# Patient Record
Sex: Male | Born: 1935 | Smoking: Former smoker
Health system: Southern US, Community
[De-identification: ages and names within clinical notes are randomized; demographics above are authoritative.]

## PROBLEM LIST (undated history)

## (undated) DIAGNOSIS — K929 Disease of digestive system, unspecified: Secondary | ICD-10-CM

## (undated) DIAGNOSIS — I499 Cardiac arrhythmia, unspecified: Secondary | ICD-10-CM

## (undated) DIAGNOSIS — E079 Disorder of thyroid, unspecified: Secondary | ICD-10-CM

## (undated) DIAGNOSIS — D649 Anemia, unspecified: Secondary | ICD-10-CM

## (undated) DIAGNOSIS — I10 Essential (primary) hypertension: Secondary | ICD-10-CM

## (undated) HISTORY — DX: Essential (primary) hypertension: I10

## (undated) HISTORY — DX: Disease of digestive system, unspecified: K92.9

## (undated) HISTORY — DX: Cardiac arrhythmia, unspecified: I49.9

## (undated) HISTORY — PX: FOOT SURGERY: SHX648

## (undated) HISTORY — PX: COLONOSCOPY: SHX174

## (undated) HISTORY — DX: Disorder of thyroid, unspecified: E07.9

## (undated) HISTORY — DX: Anemia, unspecified: D64.9

## (undated) HISTORY — PX: CARPAL TUNNEL RELEASE: SHX101

---

## 2004-12-03 ENCOUNTER — Ambulatory Visit: Payer: Self-pay | Admitting: Internal Medicine

## 2005-05-15 ENCOUNTER — Ambulatory Visit: Payer: Self-pay | Admitting: Unknown Physician Specialty

## 2005-05-27 ENCOUNTER — Ambulatory Visit: Payer: Self-pay | Admitting: Unknown Physician Specialty

## 2012-05-07 DIAGNOSIS — I4891 Unspecified atrial fibrillation: Secondary | ICD-10-CM

## 2012-05-08 DIAGNOSIS — I4891 Unspecified atrial fibrillation: Secondary | ICD-10-CM

## 2012-05-20 ENCOUNTER — Emergency Department: Payer: Self-pay | Admitting: Emergency Medicine

## 2012-05-26 ENCOUNTER — Ambulatory Visit: Payer: Self-pay | Admitting: Internal Medicine

## 2012-05-27 ENCOUNTER — Encounter: Payer: Self-pay | Admitting: Internal Medicine

## 2012-06-06 ENCOUNTER — Other Ambulatory Visit: Payer: Self-pay | Admitting: Anesthesiology

## 2012-06-06 ENCOUNTER — Inpatient Hospital Stay: Payer: Self-pay | Admitting: Internal Medicine

## 2012-06-06 DIAGNOSIS — R079 Chest pain, unspecified: Secondary | ICD-10-CM

## 2012-06-06 LAB — TROPONIN I: Troponin-I: 0.1 ng/mL — ABNORMAL HIGH

## 2012-06-06 LAB — BASIC METABOLIC PANEL
Anion Gap: 9 (ref 7–16)
BUN: 29 mg/dL — ABNORMAL HIGH (ref 7–18)
EGFR (Non-African Amer.): 43 — ABNORMAL LOW
Glucose: 137 mg/dL — ABNORMAL HIGH (ref 65–99)
Potassium: 4.3 mmol/L (ref 3.5–5.1)
Sodium: 134 mmol/L — ABNORMAL LOW (ref 136–145)

## 2012-06-06 LAB — TSH: Thyroid Stimulating Horm: 4.11 u[IU]/mL

## 2012-06-06 LAB — CK TOTAL AND CKMB (NOT AT ARMC)
CK, Total: 17 U/L — ABNORMAL LOW (ref 35–232)
CK, Total: 18 U/L — ABNORMAL LOW (ref 35–232)
CK-MB: <0.5 ng/mL — ABNORMAL LOW (ref 0.5–3.6)
CK-MB: 1.3 ng/mL (ref 0.5–3.6)

## 2012-06-06 LAB — CBC
HCT: 30.9 % — ABNORMAL LOW (ref 40.0–52.0)
MCH: 29.4 pg (ref 26.0–34.0)
MCV: 89 fL (ref 80–100)
Platelet: 299 10*3/uL (ref 150–440)
RBC: 3.47 10*6/uL — ABNORMAL LOW (ref 4.40–5.90)
WBC: 7 10*3/uL (ref 3.8–10.6)

## 2012-06-07 DIAGNOSIS — R079 Chest pain, unspecified: Secondary | ICD-10-CM

## 2012-06-07 DIAGNOSIS — R002 Palpitations: Secondary | ICD-10-CM

## 2012-06-07 LAB — BASIC METABOLIC PANEL
Anion Gap: 8 (ref 7–16)
BUN: 23 mg/dL — ABNORMAL HIGH (ref 7–18)
Calcium, Total: 8.5 mg/dL (ref 8.5–10.1)
Chloride: 104 mmol/L (ref 98–107)
EGFR (African American): >60
EGFR (Non-African Amer.): 55 — ABNORMAL LOW
Glucose: 96 mg/dL (ref 65–99)
Osmolality: 277 (ref 275–301)
Sodium: 137 mmol/L (ref 136–145)

## 2012-06-07 LAB — CBC WITH DIFFERENTIAL/PLATELET
Basophil %: 0.5 %
HCT: 27.8 % — ABNORMAL LOW (ref 40.0–52.0)
HGB: 9.1 g/dL — ABNORMAL LOW (ref 13.0–18.0)
Lymphocyte #: 1.3 10*3/uL (ref 1.0–3.6)
Lymphocyte %: 18.2 %
MCH: 29.1 pg (ref 26.0–34.0)
MCHC: 32.7 g/dL (ref 32.0–36.0)
Monocyte #: 1.3 x10 3/mm — ABNORMAL HIGH (ref 0.2–1.0)
Neutrophil #: 4.5 10*3/uL (ref 1.4–6.5)
Neutrophil %: 61.3 %
RDW: 15 % — ABNORMAL HIGH (ref 11.5–14.5)
WBC: 7.4 10*3/uL (ref 3.8–10.6)

## 2012-06-07 LAB — CK TOTAL AND CKMB (NOT AT ARMC)
CK, Total: 25 U/L — ABNORMAL LOW (ref 35–232)
CK-MB: 1.3 ng/mL (ref 0.5–3.6)

## 2012-06-07 LAB — TROPONIN I: Troponin-I: 0.1 ng/mL — ABNORMAL HIGH

## 2012-06-08 DIAGNOSIS — I369 Nonrheumatic tricuspid valve disorder, unspecified: Secondary | ICD-10-CM

## 2012-06-08 DIAGNOSIS — I4892 Unspecified atrial flutter: Secondary | ICD-10-CM

## 2012-06-08 LAB — CBC WITH DIFFERENTIAL/PLATELET
Basophil #: 0 10*3/uL (ref 0.0–0.1)
Basophil %: 0.5 %
Eosinophil #: 0.2 10*3/uL (ref 0.0–0.7)
Lymphocyte %: 19.1 %
MCH: 30.2 pg (ref 26.0–34.0)
Monocyte #: 1.3 x10 3/mm — ABNORMAL HIGH (ref 0.2–1.0)
Neutrophil #: 4.4 10*3/uL (ref 1.4–6.5)
Neutrophil %: 60.1 %
Platelet: 253 10*3/uL (ref 150–440)
RBC: 3.23 10*6/uL — ABNORMAL LOW (ref 4.40–5.90)
WBC: 7.3 10*3/uL (ref 3.8–10.6)

## 2012-06-08 LAB — BASIC METABOLIC PANEL
Anion Gap: 7 (ref 7–16)
Calcium, Total: 8.3 mg/dL — ABNORMAL LOW (ref 8.5–10.1)
Chloride: 102 mmol/L (ref 98–107)
Co2: 25 mmol/L (ref 21–32)
Osmolality: 271 (ref 275–301)
Potassium: 4.4 mmol/L (ref 3.5–5.1)

## 2012-06-08 LAB — DIGOXIN LEVEL: Digoxin: 1.83 ng/mL

## 2012-06-09 LAB — HEMATOCRIT: HCT: 26 % — ABNORMAL LOW (ref 40.0–52.0)

## 2012-06-10 ENCOUNTER — Encounter: Payer: Self-pay | Admitting: Cardiovascular Disease

## 2012-06-10 ENCOUNTER — Encounter: Payer: Self-pay | Admitting: *Deleted

## 2012-06-16 ENCOUNTER — Encounter: Payer: Self-pay | Admitting: Internal Medicine

## 2012-06-20 ENCOUNTER — Ambulatory Visit (INDEPENDENT_AMBULATORY_CARE_PROVIDER_SITE_OTHER): Payer: Medicare Other | Admitting: Cardiovascular Disease

## 2012-06-20 ENCOUNTER — Encounter: Payer: Self-pay | Admitting: Cardiovascular Disease

## 2012-06-20 VITALS — BP 110/50 | HR 47 | Ht 71.0 in | Wt 163.0 lb

## 2012-06-20 DIAGNOSIS — S82209A Unspecified fracture of shaft of unspecified tibia, initial encounter for closed fracture: Secondary | ICD-10-CM | POA: Insufficient documentation

## 2012-06-20 DIAGNOSIS — I498 Other specified cardiac arrhythmias: Secondary | ICD-10-CM

## 2012-06-20 DIAGNOSIS — I499 Cardiac arrhythmia, unspecified: Secondary | ICD-10-CM

## 2012-06-20 DIAGNOSIS — R001 Bradycardia, unspecified: Secondary | ICD-10-CM | POA: Insufficient documentation

## 2012-06-20 DIAGNOSIS — I4892 Unspecified atrial flutter: Secondary | ICD-10-CM

## 2012-06-20 NOTE — Patient Instructions (Addendum)
You are doing well. Please decrease the metoprolol to 12.5 mg twice a day Regular diet Watch for edema or worsening shortness of breath   Please call us if you have new issues that need to be addressed before your next appt.  Your physician wants you to follow-up in: 3 months.  You will receive a reminder letter in the mail two months in advance. If you don't receive a letter, please call our office to schedule the follow-up appointment.

## 2012-06-20 NOTE — Assessment & Plan Note (Signed)
Currently participating in rehabilitation at [facility], toe-touch with a walker.

## 2012-06-20 NOTE — Assessment & Plan Note (Signed)
Presenting with sinus bradycardia on metoprolol tartrate 25 mg in the morning. Digoxin recently held. We have suggested he decrease his metoprolol tartrate to 12.5 mg twice a day. If he continues to have bradycardia, we had suggested hold parameters for [facility] to hold the a.m. dose of metoprolol for heart rate less than 55.

## 2012-06-20 NOTE — Progress Notes (Signed)
Patient ID: Gerald Hines, male    DOB: [birth year], 78 y.o.   MRN: [id number]  HPI Comments: Gerald Hines is a very pleasant 78 year old gentleman with a history of left leg fracture, he is currently at [facility] rehabilitation, emphysema seen on CT scan, anemia with hemoglobin 9.9, presenting to [facility] for planned EGD found to be in atrial fibrillation with heart rate up to 118 beats per minute, shortness of breath, chest pain radiating to the left arm. Arrhythmia resolved without significant intervention and he was started on digoxin and metoprolol.  He has had followup with Dr. Worthy Keeler and digoxin was held 3 days ago, but metoprolol was decreased to 25 mg daily down from 25 mg twice a day. He denies any lightheadedness or dizziness. He has been walking with toe touch and a walker. He does have mild shortness of breath with exertion at baseline. He denies any tachycardia or palpitations.  CT scan in the hospital showed no PE, signs of emphysematous changes in both lungs. No evidence of CHF Echocardiogram showed normal LV systolic function greater than 55%, normal left atrial size, right ventricular systolic pressure 30-40 mm of mercury  Initial blood work on July 22 showed BUN 29, creatinine 1.38. He received significant IV fluids with improvement in his renal function  Initial EKG showed atrial flutter with ventricular rate 130 beats per minute EKG today shows sinus bradycardia with rate 47 beats per minute, no significant ST or T wave changes   Outpatient Encounter Prescriptions as of 06/20/2012  Medication Sig Dispense Refill  . Ferrous Sulfate (IRON) 28 MG TABS Take 28 mg by mouth daily.      Marland Kitchen levothyroxine (SYNTHROID, LEVOTHROID) 75 MCG tablet Take 75 mcg by mouth daily.      . metoprolol tartrate (LOPRESSOR) 25 MG tablet Take 25 mg by mouth daily.      Marland Kitchen omeprazole (PRILOSEC) 20 MG capsule Take 20 mg by mouth daily.      . simethicone (MYLICON) 125 MG chewable tablet Chew 125 mg  by mouth at bedtime.      . Tamsulosin HCl (FLOMAX) 0.4 MG CAPS Take 0.4 mg by mouth daily.      . vitamin B-12 (CYANOCOBALAMIN) 1000 MCG tablet Take 1,000 mcg by mouth daily.        Review of Systems  Constitutional: Negative.   HENT: Negative.   Eyes: Negative.   Respiratory: Negative.   Cardiovascular: Negative.   Gastrointestinal: Negative.   Musculoskeletal: Positive for joint swelling, arthralgias and gait problem.  Skin: Negative.   Neurological: Negative.   Hematological: Negative.   Psychiatric/Behavioral: Negative.   All other systems reviewed and are negative.    BP 110/50  Pulse 47  Ht 5\' 11"  (1.803 m)  Wt 163 lb (73.936 kg)  BMI 22.73 kg/m2  Physical Exam  Nursing note and vitals reviewed. Constitutional: He is oriented to person, place, and time. He appears well-developed and well-nourished.  HENT:  Head: Normocephalic.  Nose: Nose normal.  Mouth/Throat: Oropharynx is clear and moist.  Eyes: Conjunctivae are normal. Pupils are equal, round, and reactive to light.  Neck: Normal range of motion. Neck supple. No JVD present.  Cardiovascular: Normal rate, regular rhythm, S1 normal, S2 normal, normal heart sounds and intact distal pulses.  Exam reveals no gallop and no friction rub.   No murmur heard. Pulmonary/Chest: Effort normal and breath sounds normal. No respiratory distress. He has no wheezes. He has no rales. He exhibits no tenderness.  Abdominal:  Soft. Bowel sounds are normal. He exhibits no distension. There is no tenderness.  Musculoskeletal: Normal range of motion. He exhibits no edema and no tenderness.  Lymphadenopathy:    He has no cervical adenopathy.  Neurological: He is alert and oriented to person, place, and time. Coordination normal.  Skin: Skin is warm and dry. No rash noted. No erythema.  Psychiatric: He has a normal mood and affect. His behavior is normal. Judgment and thought content normal.           Assessment and Plan

## 2012-06-20 NOTE — Assessment & Plan Note (Signed)
His rhythm appeared to be atrial flutter. If he has this rhythm in the future, options include trying an antiarrhythmic such as low-dose amiodarone. For persistent symptoms, possibly even ablation.

## 2012-06-21 LAB — DIGOXIN LEVEL: Digoxin: 0.28 ng/mL

## 2012-06-30 LAB — CBC WITH DIFFERENTIAL/PLATELET
Basophil #: 0 10*3/uL (ref 0.0–0.1)
Eosinophil: 2 %
HCT: 25.9 % — ABNORMAL LOW (ref 40.0–52.0)
HGB: 8.6 g/dL — ABNORMAL LOW (ref 13.0–18.0)
Lymphocytes: 18 %
MCHC: 33.1 g/dL (ref 32.0–36.0)
MCV: 86 fL (ref 80–100)
Platelet: 263 10*3/uL (ref 150–440)
RBC: 3.01 10*6/uL — ABNORMAL LOW (ref 4.40–5.90)
RDW: 16 % — ABNORMAL HIGH (ref 11.5–14.5)
Segmented Neutrophils: 61 %
WBC: 7 10*3/uL (ref 3.8–10.6)

## 2012-06-30 LAB — URINALYSIS, COMPLETE
Bacteria: NONE SEEN
Glucose,UR: NEGATIVE mg/dL (ref 0–75)
Leukocyte Esterase: NEGATIVE
Nitrite: NEGATIVE
RBC,UR: 1 /HPF (ref 0–5)
Squamous Epithelial: NONE SEEN
Transitional Epi: <1
WBC UR: <1 /HPF (ref 0–5)

## 2012-06-30 LAB — FERRITIN: Ferritin (ARMC): 589 ng/mL — ABNORMAL HIGH (ref 8–388)

## 2012-06-30 LAB — RETICULOCYTES: Absolute Retic Count: 0.031 10*6/uL (ref 0.031–0.129)

## 2012-06-30 LAB — IRON AND TIBC
Iron Bind.Cap.(Total): 194 ug/dL — ABNORMAL LOW (ref 250–450)
Iron: 38 ug/dL — ABNORMAL LOW (ref 65–175)
Unbound Iron-Bind.Cap.: 156 ug/dL

## 2012-06-30 LAB — FOLATE: Folic Acid: 19.7 ng/mL (ref 3.1–100.0)

## 2012-06-30 LAB — SEDIMENTATION RATE: Erythrocyte Sed Rate: >140 mm/hr — ABNORMAL HIGH (ref 0–20)

## 2012-07-01 ENCOUNTER — Telehealth: Payer: Self-pay

## 2012-07-01 LAB — STOOL CULTURE

## 2012-07-01 NOTE — Telephone Encounter (Signed)
Patient's report of vital signs since changed to Metoprolol to 12.5 mg one tablet twice a day. Heart rate is usually under 55 in the am.  A list of vital signs given to Dr. Mariah Milling for review. Blood pressure & HR range since 06/10/2012- 126/67 HR 67,  06/14/2012- 104/49 HR 48, 06/24/2012- 134/61 HR 48, 06/29/2012- 123/62 HR 57. Please advise.

## 2012-07-04 NOTE — Telephone Encounter (Signed)
I called and spoke with [facility] caregiver for pt. I advsied, per Dr. Windell Hummingbird last note, ok to hold am dose of metoprolol if HR<55 BPM.  Caregiver verb. Understanding.

## 2012-07-05 NOTE — Telephone Encounter (Signed)
Let's hold p.m. dose of metoprolol, continue a.m. Dose Low heart rate in the morning is likely from the evening dose thx

## 2012-07-06 NOTE — Telephone Encounter (Signed)
Caregiver informed at [facility] [place]. Understanding verb.

## 2012-07-17 ENCOUNTER — Encounter: Payer: Self-pay | Admitting: Internal Medicine

## 2012-07-29 ENCOUNTER — Telehealth: Payer: Self-pay | Admitting: Cardiovascular Disease

## 2012-07-29 NOTE — Telephone Encounter (Signed)
Caregiver made aware. She says HR only increases with exertion. Since episode the am, HR has been 60-70 BPM.   I had her hold while I addressed with Dr. Mariah Milling. He says we will not start amiodarone but instead order e cardio 30 day event monitor and bring pt back in 2 weeks. Pt's caregiver and son made aware. Understanding verb. appt made with dr. Mariah Milling 9/24 at 1:45 pm

## 2012-07-29 NOTE — Telephone Encounter (Signed)
I spoke with pt's care giver at [facility]. He says pt has been having these spells where pt "passes out with eyes open, unresponsive, lasting about 10 seconds each time".  Gerald Hines says he has had about 3 spells like this within the last 3 weeks.  The patient then ahd same spell today while pt's son was present. Son says these were happening at home as well, prior to coming to [facility]. Gerald Hines says BP stays around 89/64 and HR=160 BPM. Pt no longer takes the metoprolol. Pt is now alert and oriented. I told Gerald Hines I would discuss with Dr. Mariah Milling and call [facility] back. Understanding verb.

## 2012-07-29 NOTE — Telephone Encounter (Signed)
Pt is a [facility]. Nurse Jenita Seashore called stating pt has passed out 3 times within 3 weeks. His BP 89/64 and slowly goes back to normal. Pt says he feels dizzy when this happens not on any meds for bp No other symptoms.

## 2012-07-29 NOTE — Telephone Encounter (Signed)
"  Start amiodarone 200 mg BID x 4 days then decrease to 200 mg daily.  F/u with me in 2 weeks". V.O Dr. Alvis Lemmings, RN

## 2012-08-03 DIAGNOSIS — I4891 Unspecified atrial fibrillation: Secondary | ICD-10-CM

## 2012-08-09 ENCOUNTER — Ambulatory Visit (INDEPENDENT_AMBULATORY_CARE_PROVIDER_SITE_OTHER): Payer: Medicare Other | Admitting: Cardiovascular Disease

## 2012-08-09 ENCOUNTER — Encounter: Payer: Self-pay | Admitting: Cardiovascular Disease

## 2012-08-09 VITALS — BP 100/42 | HR 77 | Ht 71.0 in | Wt 152.0 lb

## 2012-08-09 DIAGNOSIS — R609 Edema, unspecified: Secondary | ICD-10-CM

## 2012-08-09 DIAGNOSIS — S82209A Unspecified fracture of shaft of unspecified tibia, initial encounter for closed fracture: Secondary | ICD-10-CM

## 2012-08-09 DIAGNOSIS — I4892 Unspecified atrial flutter: Secondary | ICD-10-CM

## 2012-08-09 DIAGNOSIS — I498 Other specified cardiac arrhythmias: Secondary | ICD-10-CM

## 2012-08-09 DIAGNOSIS — R001 Bradycardia, unspecified: Secondary | ICD-10-CM

## 2012-08-09 NOTE — Assessment & Plan Note (Signed)
Maintain normal sinus rhythm. The Toprol and digoxin held secondary to bradycardia

## 2012-08-09 NOTE — Progress Notes (Signed)
Patient ID: Gerald Hines, male    DOB: 1914/04/16, 78 y.o.   MRN: [id number]  HPI Comments: Gerald Hines is a very pleasant 78 year old gentleman with a history of left leg fracture, [facility], emphysema seen on CT scan, anemia with hemoglobin 9.9, presenting to [facility] for planned EGD found to be in atrial fibrillation with heart rate up to 118 beats per minute, shortness of breath, chest pain radiating to the left arm. Arrhythmia resolved without significant intervention and he was started on digoxin and metoprolol. Medications have since been held secondary to bradycardia and low blood pressure. He did have symptoms of dizziness which have since resolved.  He presents today and reports that overall he is doing better. He does have trace edema around his ankles which he reports is from drinking too much. Hemoglobin is low in the low 8 range and has been slowly declining. He reports blood pressure at home is in the 120 range over 60, heart rate in the 60s. He is currently wearing a 30 day monitor, day 5. No arrhythmia seen. Heart rate typically in the 60s to 70s range.  CT scan in the hospital showed no PE, signs of emphysematous changes in both lungs. No evidence of CHF Echocardiogram showed normal LV systolic function greater than 55%, normal left atrial size, right ventricular systolic pressure 30-40 mm of mercury  Initial blood work on July 22 showed BUN 29, creatinine 1.38. He received significant IV fluids with improvement in his renal function  Initial EKG showed atrial flutter with ventricular rate 130 beats per minute Followup EKG  showed sinus bradycardia with rate 47 beats per minute, no significant ST or T wave changes EKG today shows normal sinus rhythm, rate 77 beats per minute, no significant ST or T wave changes   Outpatient Encounter Prescriptions as of 08/09/2012  Medication Sig Dispense Refill  . Ferrous Sulfate (IRON) 28 MG TABS Take 28 mg by mouth daily.        Marland Kitchen levothyroxine (SYNTHROID, LEVOTHROID) 75 MCG tablet Take 75 mcg by mouth daily.      . simethicone (MYLICON) 125 MG chewable tablet Chew 125 mg by mouth at bedtime.      . vitamin B-12 (CYANOCOBALAMIN) 1000 MCG tablet Take 1,000 mcg by mouth daily.        Review of Systems  Constitutional: Negative.   HENT: Negative.   Eyes: Negative.   Respiratory: Negative.   Cardiovascular: Negative.   Gastrointestinal: Negative.   Musculoskeletal: Positive for joint swelling, arthralgias and gait problem.  Skin: Negative.   Neurological: Negative.   Hematological: Negative.   Psychiatric/Behavioral: Negative.   All other systems reviewed and are negative.    BP 100/42  Pulse 77  Ht 5\' 11"  (1.803 m)  Wt 152 lb (68.947 kg)  BMI 21.20 kg/m2  Physical Exam  Nursing note and vitals reviewed. Constitutional: He is oriented to person, place, and time. He appears well-developed and well-nourished.       Sitting in a wheelchair, thin  HENT:  Head: Normocephalic.  Nose: Nose normal.  Mouth/Throat: Oropharynx is clear and moist.  Eyes: Conjunctivae normal are normal. Pupils are equal, round, and reactive to light.  Neck: Normal range of motion. Neck supple. No JVD present.  Cardiovascular: Normal rate, regular rhythm, S1 normal, S2 normal, normal heart sounds and intact distal pulses.  Exam reveals no gallop and no friction rub.   No murmur heard.      Trace edema around ankles and feet  bilaterally  Pulmonary/Chest: Effort normal and breath sounds normal. No respiratory distress. He has no wheezes. He has no rales. He exhibits no tenderness.  Abdominal: Soft. Bowel sounds are normal. He exhibits no distension. There is no tenderness.  Musculoskeletal: Normal range of motion. He exhibits no edema and no tenderness.  Lymphadenopathy:    He has no cervical adenopathy.  Neurological: He is alert and oriented to person, place, and time. Coordination normal.  Skin: Skin is warm and dry. No rash  noted. No erythema.  Psychiatric: He has a normal mood and affect. His behavior is normal. Judgment and thought content normal.           Assessment and Plan

## 2012-08-09 NOTE — Patient Instructions (Addendum)
You are doing well. No medication changes were made.  Please call if leg edema gets worse. We would start a diuretic  Please call us if you have new issues that need to be addressed before your next appt.  Your physician wants you to follow-up in: 3 months.  You will receive a reminder letter in the mail two months in advance. If you don't receive a letter, please call our office to schedule the follow-up appointment.

## 2012-08-09 NOTE — Assessment & Plan Note (Signed)
He has finished his rehabilitation, and ambulating slowly. Continues to be very weak.

## 2012-08-09 NOTE — Assessment & Plan Note (Signed)
Mild edema of the feet and ankles. He reports he is drinking significant amount of fluids. We have suggested he decrease his by mouth fluid intake. He did report low sodium. Perhaps he could liberalize his salt intake. He could use Lasix as needed if the edema gets worse. He will call the office if he needs Lasix.

## 2012-08-09 NOTE — Assessment & Plan Note (Signed)
No significant bradycardia seen on monitor. He will continue to wear this for up to 30 days, as tolerated.

## 2012-08-31 ENCOUNTER — Emergency Department: Payer: Self-pay | Admitting: Emergency Medicine

## 2012-08-31 LAB — COMPREHENSIVE METABOLIC PANEL
Alkaline Phosphatase: 98 U/L (ref 50–136)
Bilirubin,Total: 0.6 mg/dL (ref 0.2–1.0)
Calcium, Total: 8.9 mg/dL (ref 8.5–10.1)
Co2: 24 mmol/L (ref 21–32)
Creatinine: 0.99 mg/dL (ref 0.60–1.30)
EGFR (African American): >60
Osmolality: 273 (ref 275–301)
Sodium: 136 mmol/L (ref 136–145)
Total Protein: 7 g/dL (ref 6.4–8.2)

## 2012-08-31 LAB — CK TOTAL AND CKMB (NOT AT ARMC)
CK, Total: 14 U/L — ABNORMAL LOW (ref 35–232)
CK-MB: <0.5 ng/mL — ABNORMAL LOW (ref 0.5–3.6)

## 2012-08-31 LAB — CBC
MCH: 29.1 pg (ref 26.0–34.0)
MCHC: 34.3 g/dL (ref 32.0–36.0)
MCV: 85 fL (ref 80–100)
Platelet: 309 10*3/uL (ref 150–440)
RBC: 3 10*6/uL — ABNORMAL LOW (ref 4.40–5.90)
RDW: 17.2 % — ABNORMAL HIGH (ref 11.5–14.5)
WBC: 9.1 10*3/uL (ref 3.8–10.6)

## 2012-08-31 LAB — TROPONIN I: Troponin-I: <0.02 ng/mL

## 2012-09-09 ENCOUNTER — Inpatient Hospital Stay: Payer: Self-pay | Admitting: Internal Medicine

## 2012-09-09 ENCOUNTER — Encounter: Payer: Self-pay | Admitting: Cardiovascular Disease

## 2012-09-09 LAB — CBC WITH DIFFERENTIAL/PLATELET
Basophil %: 0.4 %
Eosinophil #: 0 10*3/uL (ref 0.0–0.7)
Eosinophil %: 0.3 %
Lymphocyte #: 2.2 10*3/uL (ref 1.0–3.6)
MCH: 27 pg (ref 26.0–34.0)
MCHC: 32.1 g/dL (ref 32.0–36.0)
Monocyte #: 1.9 x10 3/mm — ABNORMAL HIGH (ref 0.2–1.0)
Platelet: 384 10*3/uL (ref 150–440)
RDW: 17.7 % — ABNORMAL HIGH (ref 11.5–14.5)
Segmented Neutrophils: 70 %
Variant Lymphocyte - H1-Rlymph: 5 %
WBC: 12.1 10*3/uL — ABNORMAL HIGH (ref 3.8–10.6)

## 2012-09-09 LAB — COMPREHENSIVE METABOLIC PANEL
Alkaline Phosphatase: 106 U/L (ref 50–136)
Anion Gap: 10 (ref 7–16)
BUN: 16 mg/dL (ref 7–18)
Calcium, Total: 9 mg/dL (ref 8.5–10.1)
Co2: 27 mmol/L (ref 21–32)
Creatinine: 0.91 mg/dL (ref 0.60–1.30)
EGFR (African American): >60
EGFR (Non-African Amer.): >60
Osmolality: 268 (ref 275–301)
Potassium: 3.7 mmol/L (ref 3.5–5.1)
SGPT (ALT): 47 U/L (ref 12–78)
Sodium: 133 mmol/L — ABNORMAL LOW (ref 136–145)
Total Protein: 7.2 g/dL (ref 6.4–8.2)

## 2012-09-09 LAB — PROTIME-INR
INR: 1.2
Prothrombin Time: 15.7 secs — ABNORMAL HIGH (ref 11.5–14.7)

## 2012-09-09 LAB — APTT: Activated PTT: 44.9 secs — ABNORMAL HIGH (ref 23.6–35.9)

## 2012-09-10 LAB — URINALYSIS, COMPLETE
Bacteria: NONE SEEN
Leukocyte Esterase: NEGATIVE
Nitrite: NEGATIVE
RBC,UR: 1 /HPF (ref 0–5)
Squamous Epithelial: NONE SEEN
WBC UR: 1 /HPF (ref 0–5)

## 2012-09-10 LAB — CBC WITH DIFFERENTIAL/PLATELET
Basophil #: 0 10*3/uL (ref 0.0–0.1)
Eosinophil #: 0 10*3/uL (ref 0.0–0.7)
Eosinophil %: 0.4 %
HGB: 8.3 g/dL — ABNORMAL LOW (ref 13.0–18.0)
Lymphocyte %: 17.7 %
MCH: 28.1 pg (ref 26.0–34.0)
MCHC: 33.4 g/dL (ref 32.0–36.0)
Monocyte #: 1.3 x10 3/mm — ABNORMAL HIGH (ref 0.2–1.0)
Neutrophil %: 65.7 %
Platelet: 282 10*3/uL (ref 150–440)
RBC: 2.96 10*6/uL — ABNORMAL LOW (ref 4.40–5.90)
WBC: 8.2 10*3/uL (ref 3.8–10.6)

## 2012-09-10 LAB — BASIC METABOLIC PANEL
Anion Gap: 9 (ref 7–16)
BUN: 14 mg/dL (ref 7–18)
Calcium, Total: 8.4 mg/dL — ABNORMAL LOW (ref 8.5–10.1)
Creatinine: 0.86 mg/dL (ref 0.60–1.30)
EGFR (African American): >60
EGFR (Non-African Amer.): >60
Glucose: 105 mg/dL — ABNORMAL HIGH (ref 65–99)
Osmolality: 273 (ref 275–301)

## 2012-09-10 LAB — TROPONIN I: Troponin-I: 0.11 ng/mL — ABNORMAL HIGH

## 2012-09-11 LAB — CBC WITH DIFFERENTIAL/PLATELET
Basophil #: 0.1 10*3/uL (ref 0.0–0.1)
Eosinophil #: 0 10*3/uL (ref 0.0–0.7)
HGB: 9.2 g/dL — ABNORMAL LOW (ref 13.0–18.0)
Lymphocyte %: 18.1 %
MCHC: 33 g/dL (ref 32.0–36.0)
Monocyte #: 1.4 x10 3/mm — ABNORMAL HIGH (ref 0.2–1.0)
Neutrophil %: 63.8 %
RDW: 17.1 % — ABNORMAL HIGH (ref 11.5–14.5)
WBC: 8.6 10*3/uL (ref 3.8–10.6)

## 2012-09-12 LAB — CBC WITH DIFFERENTIAL/PLATELET
Basophil #: 0.1 10*3/uL (ref 0.0–0.1)
Basophil %: 1.3 %
Eosinophil %: 0.5 %
HCT: 28 % — ABNORMAL LOW (ref 40.0–52.0)
HGB: 9.4 g/dL — ABNORMAL LOW (ref 13.0–18.0)
Lymphocyte #: 1.6 10*3/uL (ref 1.0–3.6)
Lymphocyte %: 17.5 %
MCV: 85 fL (ref 80–100)
Monocyte #: 1.3 x10 3/mm — ABNORMAL HIGH (ref 0.2–1.0)
Monocyte %: 14.3 %
Neutrophil #: 6 10*3/uL (ref 1.4–6.5)
RBC: 3.3 10*6/uL — ABNORMAL LOW (ref 4.40–5.90)
RDW: 17.4 % — ABNORMAL HIGH (ref 11.5–14.5)

## 2012-09-14 LAB — PATHOLOGY REPORT

## 2012-09-15 ENCOUNTER — Encounter: Payer: Self-pay | Admitting: Internal Medicine

## 2012-09-16 ENCOUNTER — Encounter: Payer: Self-pay | Admitting: Internal Medicine

## 2012-09-21 ENCOUNTER — Ambulatory Visit: Payer: Medicare Other | Admitting: Cardiovascular Disease

## 2012-11-16 DEATH — deceased

## 2013-06-29 IMAGING — CR DG TIBIA/FIBULA 2V*L*
1 series · 3 of 3 positions shown · non-contrast
Comparison: none

REASON FOR EXAM: pain 2nd to fall
COMMENTS:   May transport without cardiac monitor

[Series 5: x tib-fib lat left · 0.14mm/px · 3 of 3 slices shown]
[im 1/3]
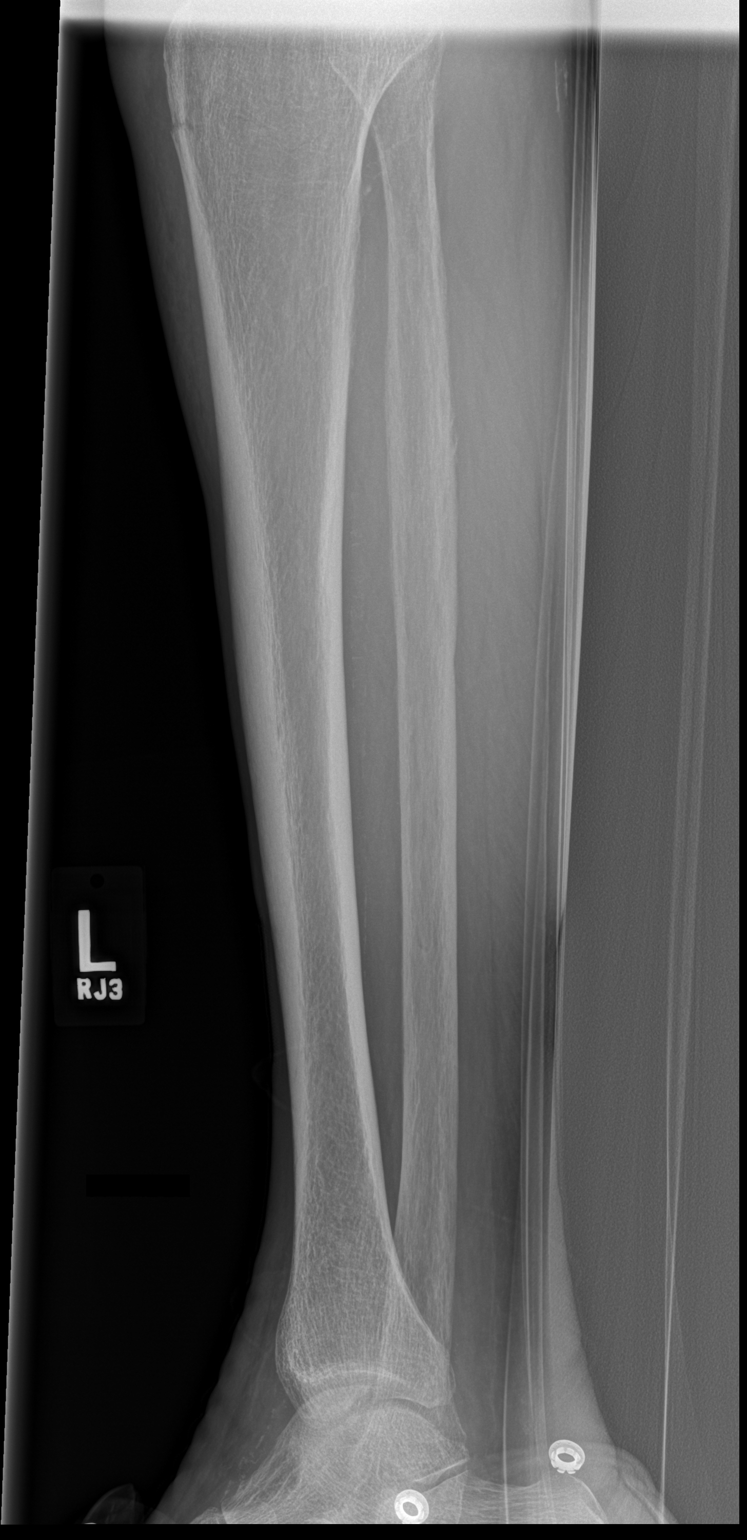
[im 2/3]
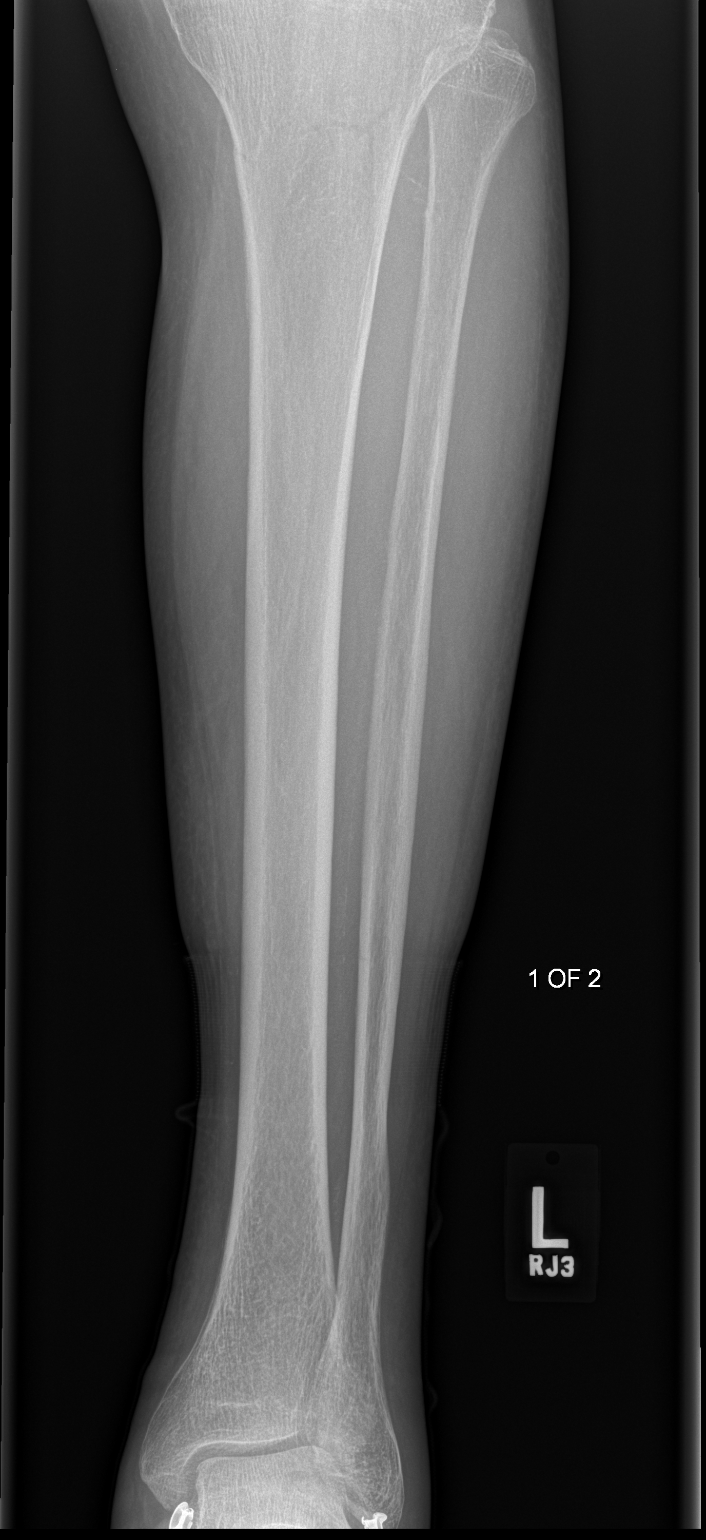
[im 3/3]
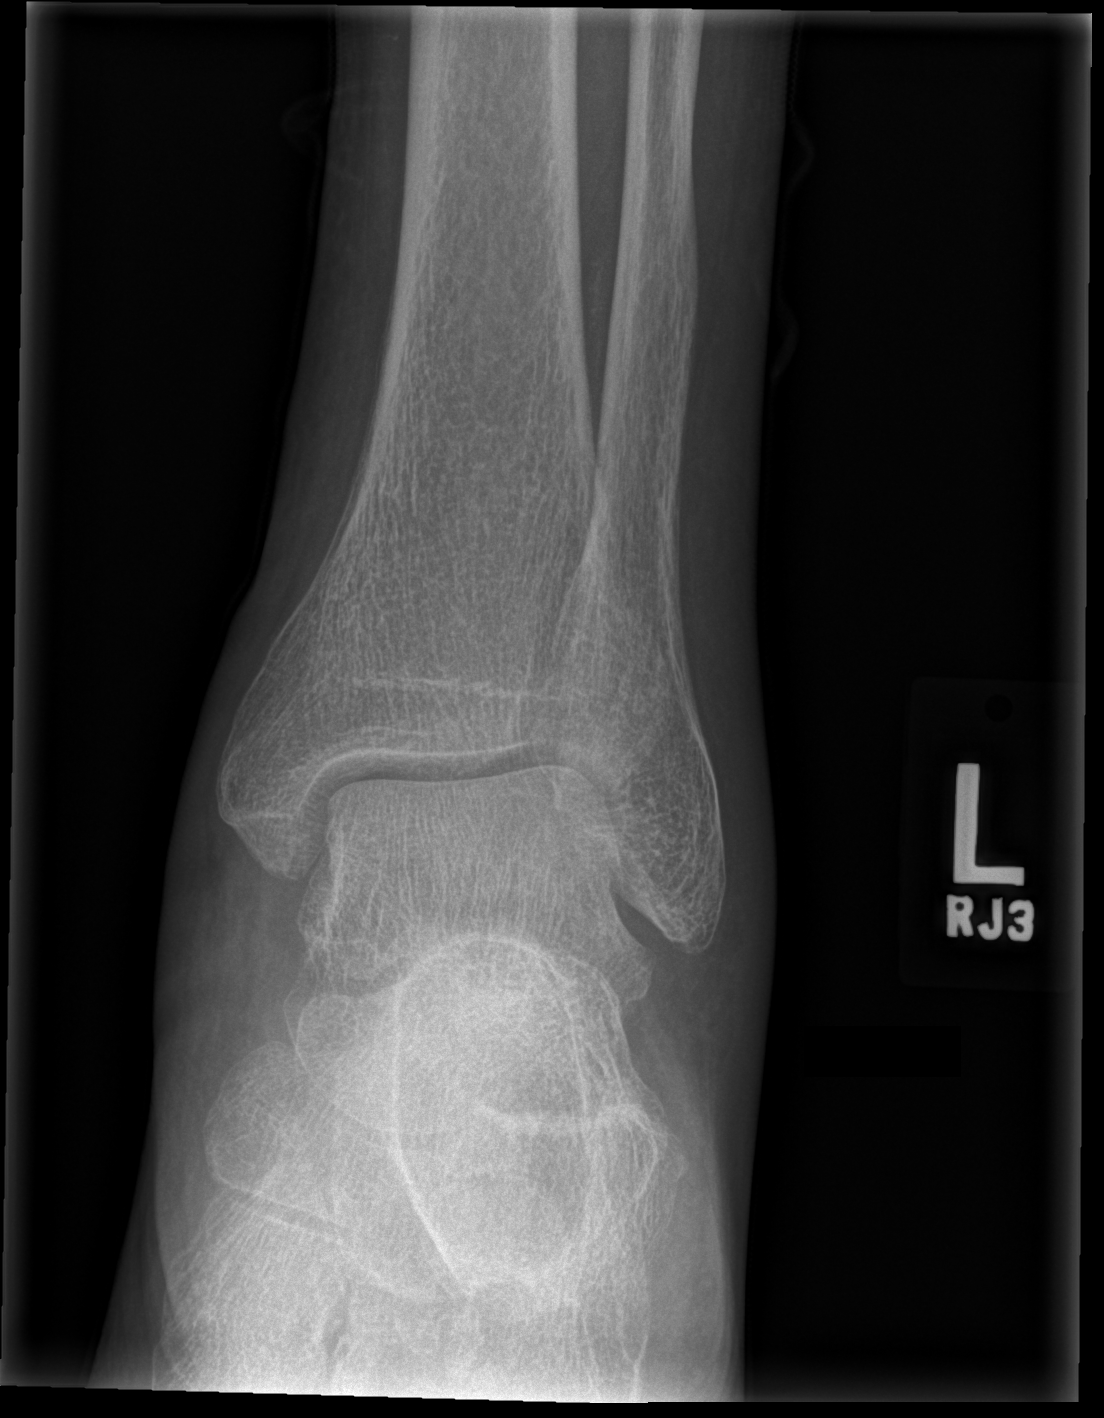

[3 of 3 positions shown; findings below may reference images not displayed]

PROCEDURE:     DXR - DXR TIBIA AND FIBULA LT (LOWER L  - May 20, 2012  [DATE]

RESULT:     Images of the left tibia and fibula show osteopenia. There is a
poorly demonstrated area of lucency in the proximal tibial shaft just
inferior to the region of the tibial tuberosity consistent with a
nondisplaced fracture. This may be incomplete. Fibula appears intact.
IMPRESSION: 1. Nondisplaced proximal left tibial fracture.

[REDACTED]

## 2013-06-29 IMAGING — CR DG CHEST 1V
1 series · 1 of 1 positions shown · non-contrast
Comparison: none

REASON FOR EXAM: chest pain
COMMENTS:   May transport without cardiac monitor

PROCEDURE:     DXR - DXR CHEST 1 VIEWAP OR PA  - May 20, 2012  [DATE]
RESULT:     Comparison: None

[t chest supine]
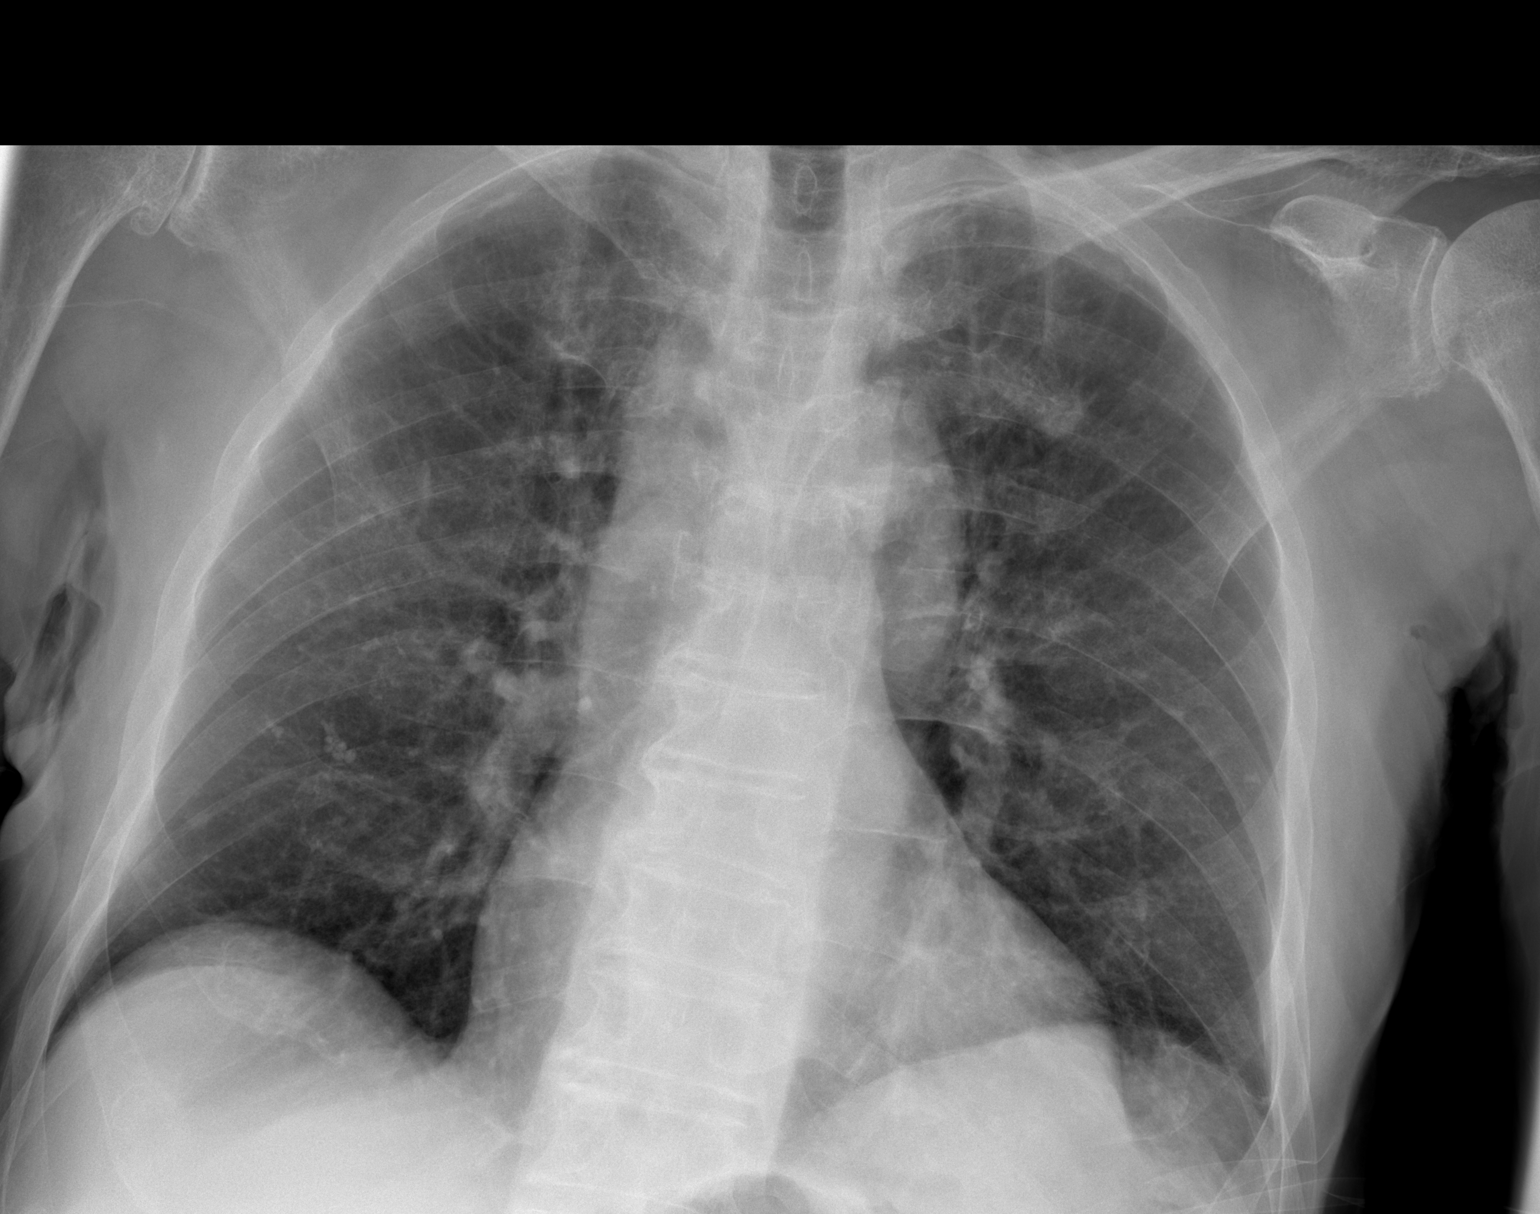

[1 of 1 positions shown; findings below may reference images not displayed]

FINDINGS: Single portable AP chest radiograph is provided.  There is no focal
parenchymal opacity, pleural effusion, or pneumothorax. Normal
cardiomediastinal silhouette. The osseous structures are unremarkable.
IMPRESSION: No acute disease of the che[REDACTED]

## 2013-07-05 IMAGING — US US EXTREM LOW VENOUS*L*
1 series · 14 of 24 positions shown · non-contrast
Comparison: none

REASON FOR EXAM: increased edema  recent lt leg fracture  CR  [id number]
COMMENTS:

[Series 1: us extrem low venous*left* · 0.11mm/px · 14 of 25 slices shown]
[im 1/25]
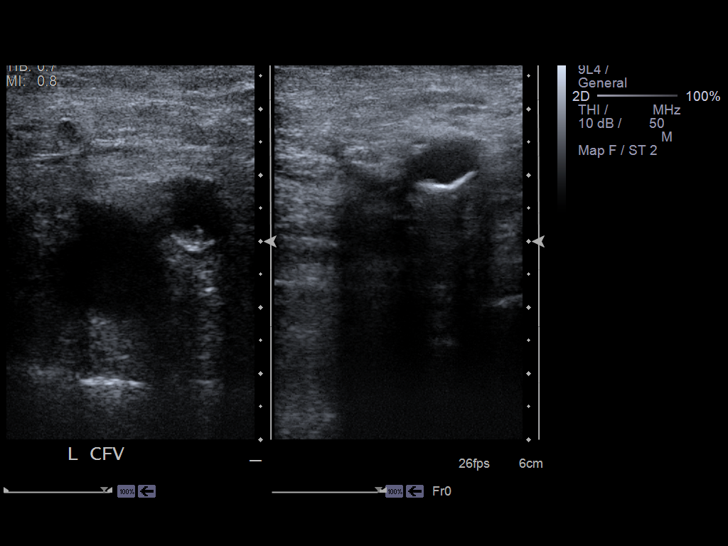
[im 3/25]
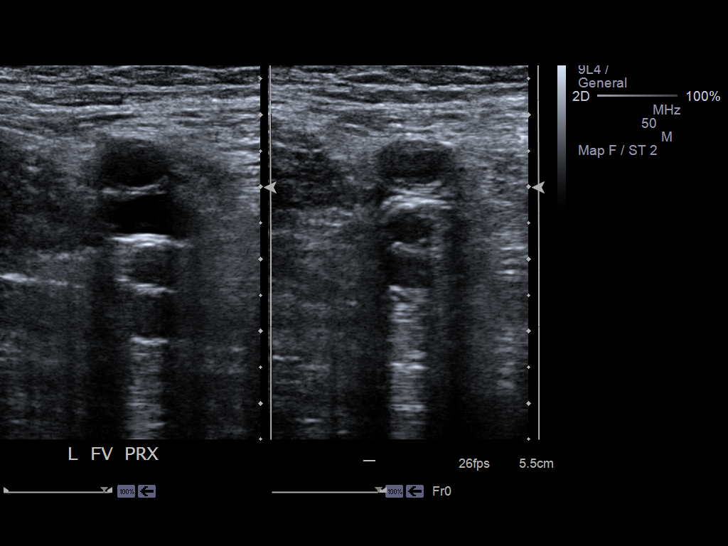
[im 5/25]
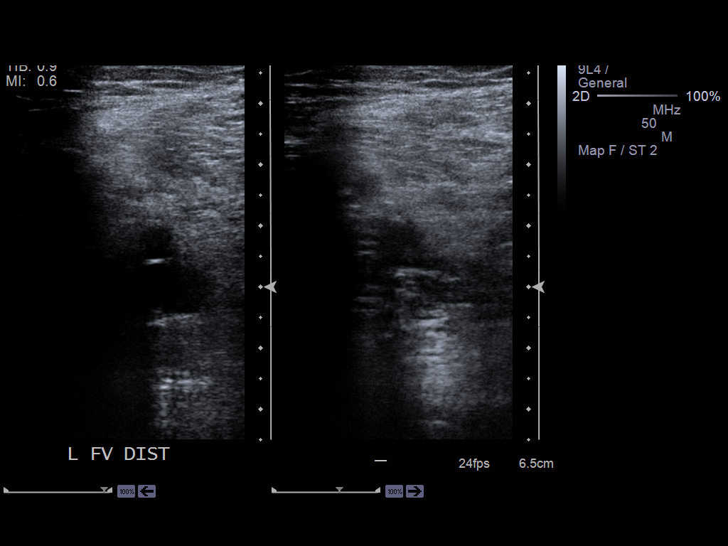
[im 7/25]
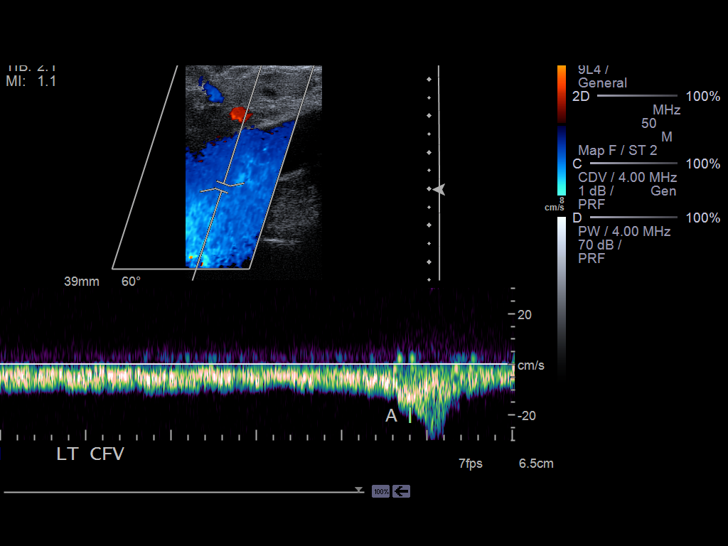
[im 8/25]
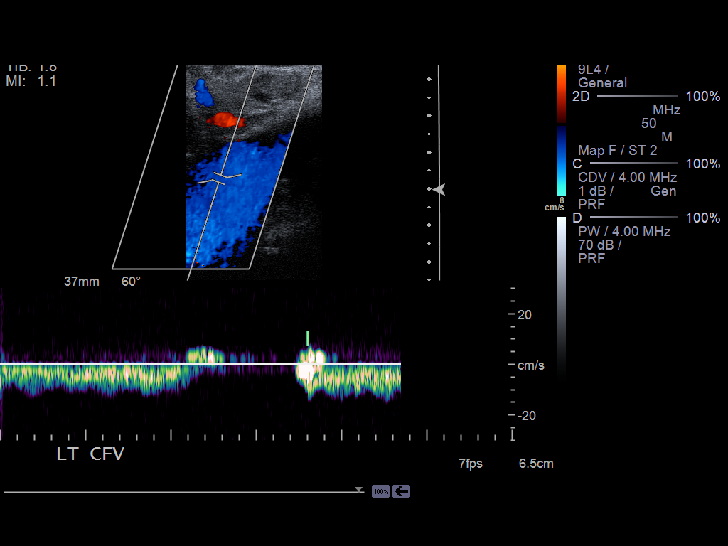
[im 10/25]
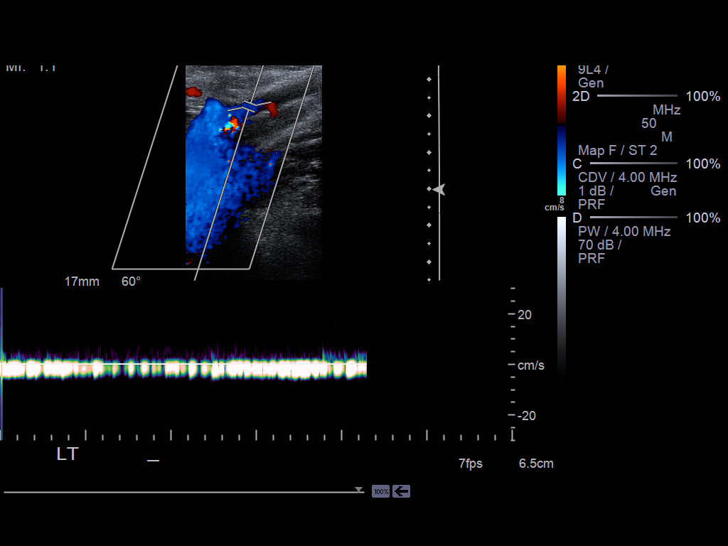
[im 12/25]
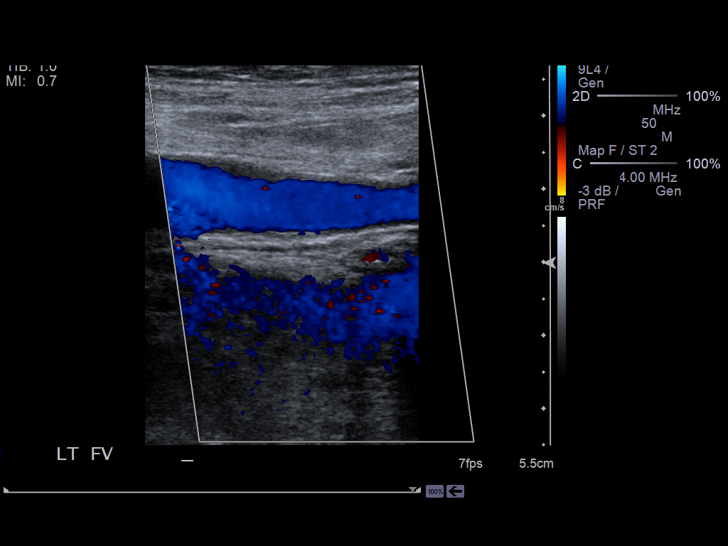
[im 13/25]
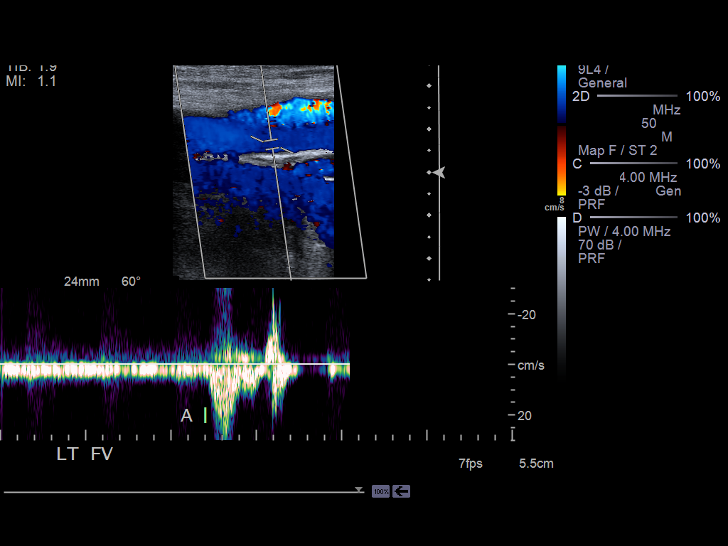
[im 15/25]
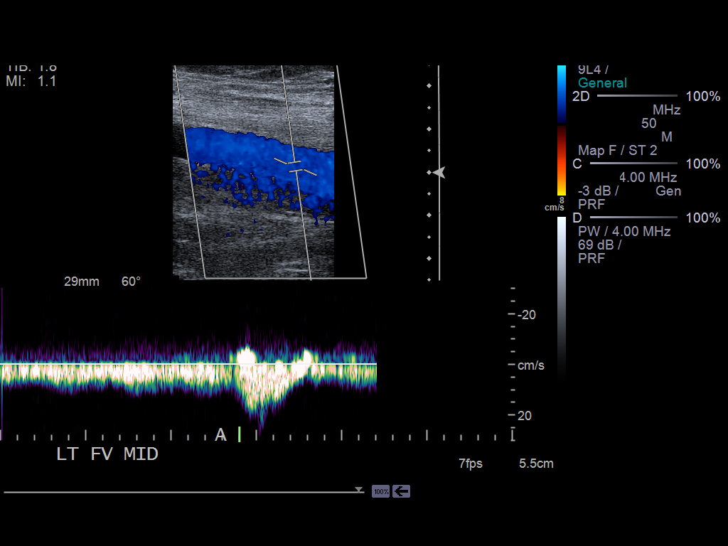
[im 17/25]
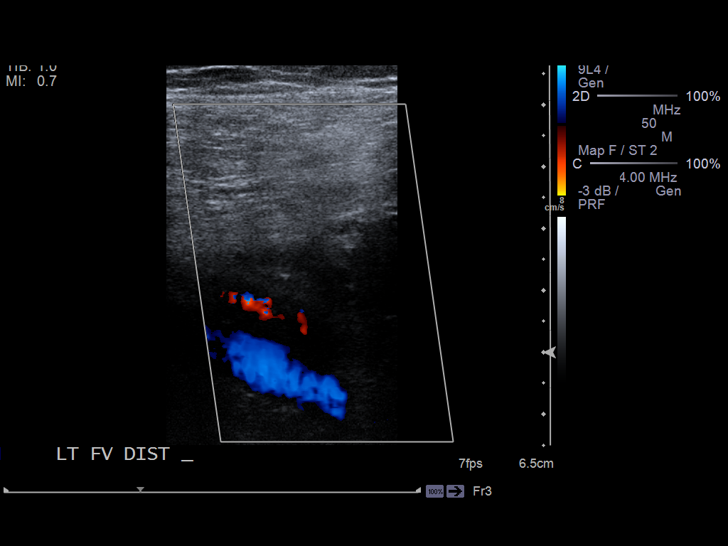
[im 19/25]
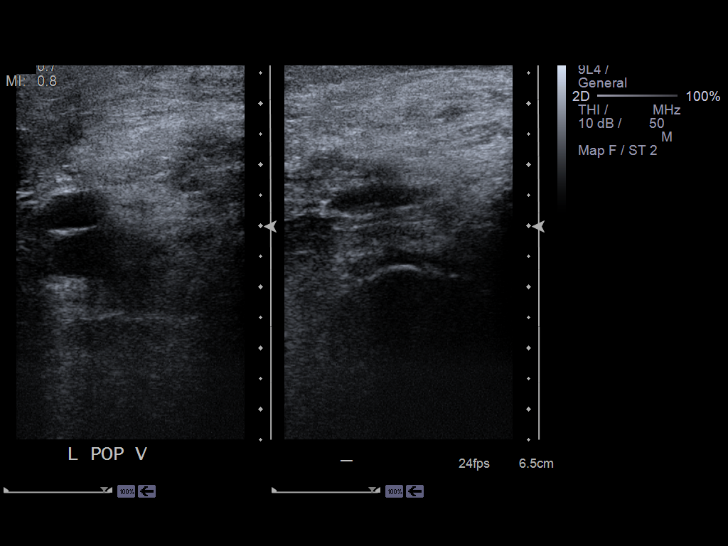
[im 20/25]
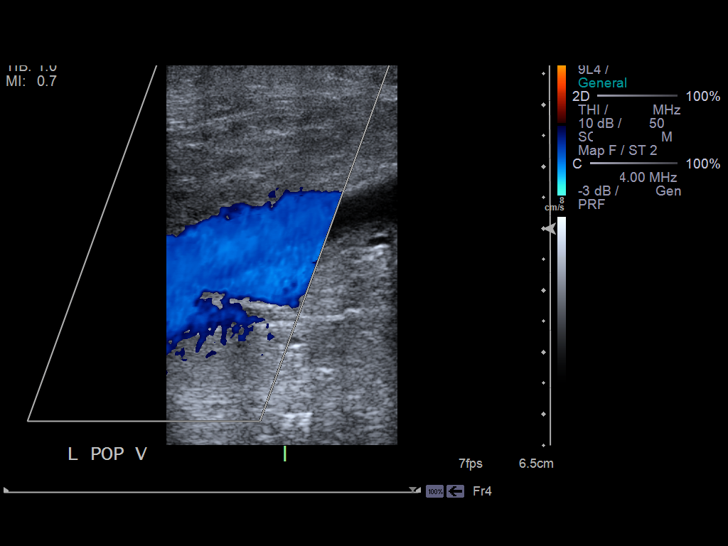
[im 22/25]
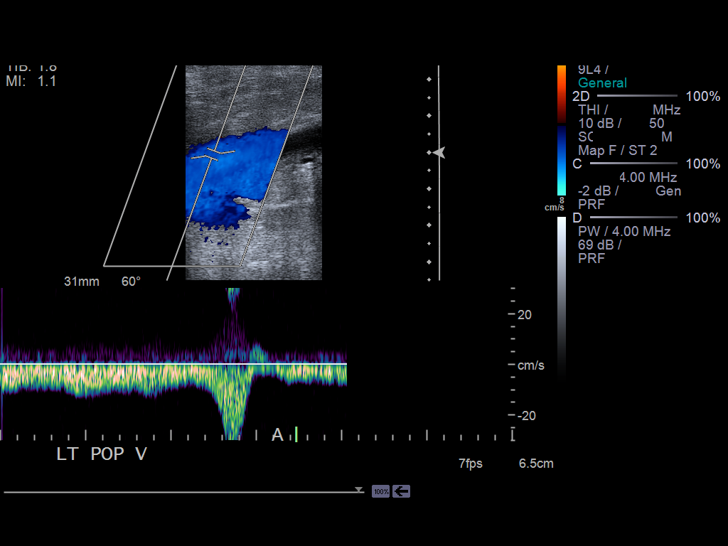
[im 25/25]
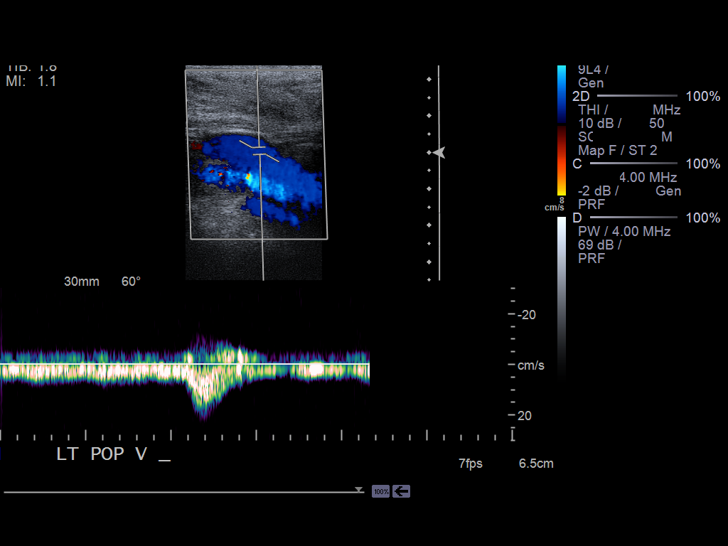

[14 of 24 positions shown; findings below may reference images not displayed]

PROCEDURE:     US  - US DOPPLER LOW EXTR LEFT  - May 26, 2012  [DATE]

RESULT:     Comparison: None

Technique and findings: Multiple longitudinal and transverse grayscale as
well as color and spectral Doppler images of the left lower extremity veins
were obtained from the common femoral veins through the popliteal veins.

The left common femoral, femoral, and popliteal veins are patent,
demonstrating normal color-flow and compressibility. No intraluminal
thrombus is identified.  There is normal respiratory variation and
augmentation demonstrated at all vein levels.
IMPRESSION: No evidence of DVT in the left lower extremity.

## 2013-07-16 IMAGING — CR DG CHEST 1V PORT
1 series · 1 of 1 positions shown · non-contrast
Comparison: none

REASON FOR EXAM: Chest Pain
COMMENTS:

PROCEDURE:     DXR - DXR PORTABLE CHEST SINGLE VIEW  - June 06, 2012 [DATE]
RESULT:     The lungs are clear. The cardiac silhouette and visualized bony
skeleton are unremarkable.

[portable]
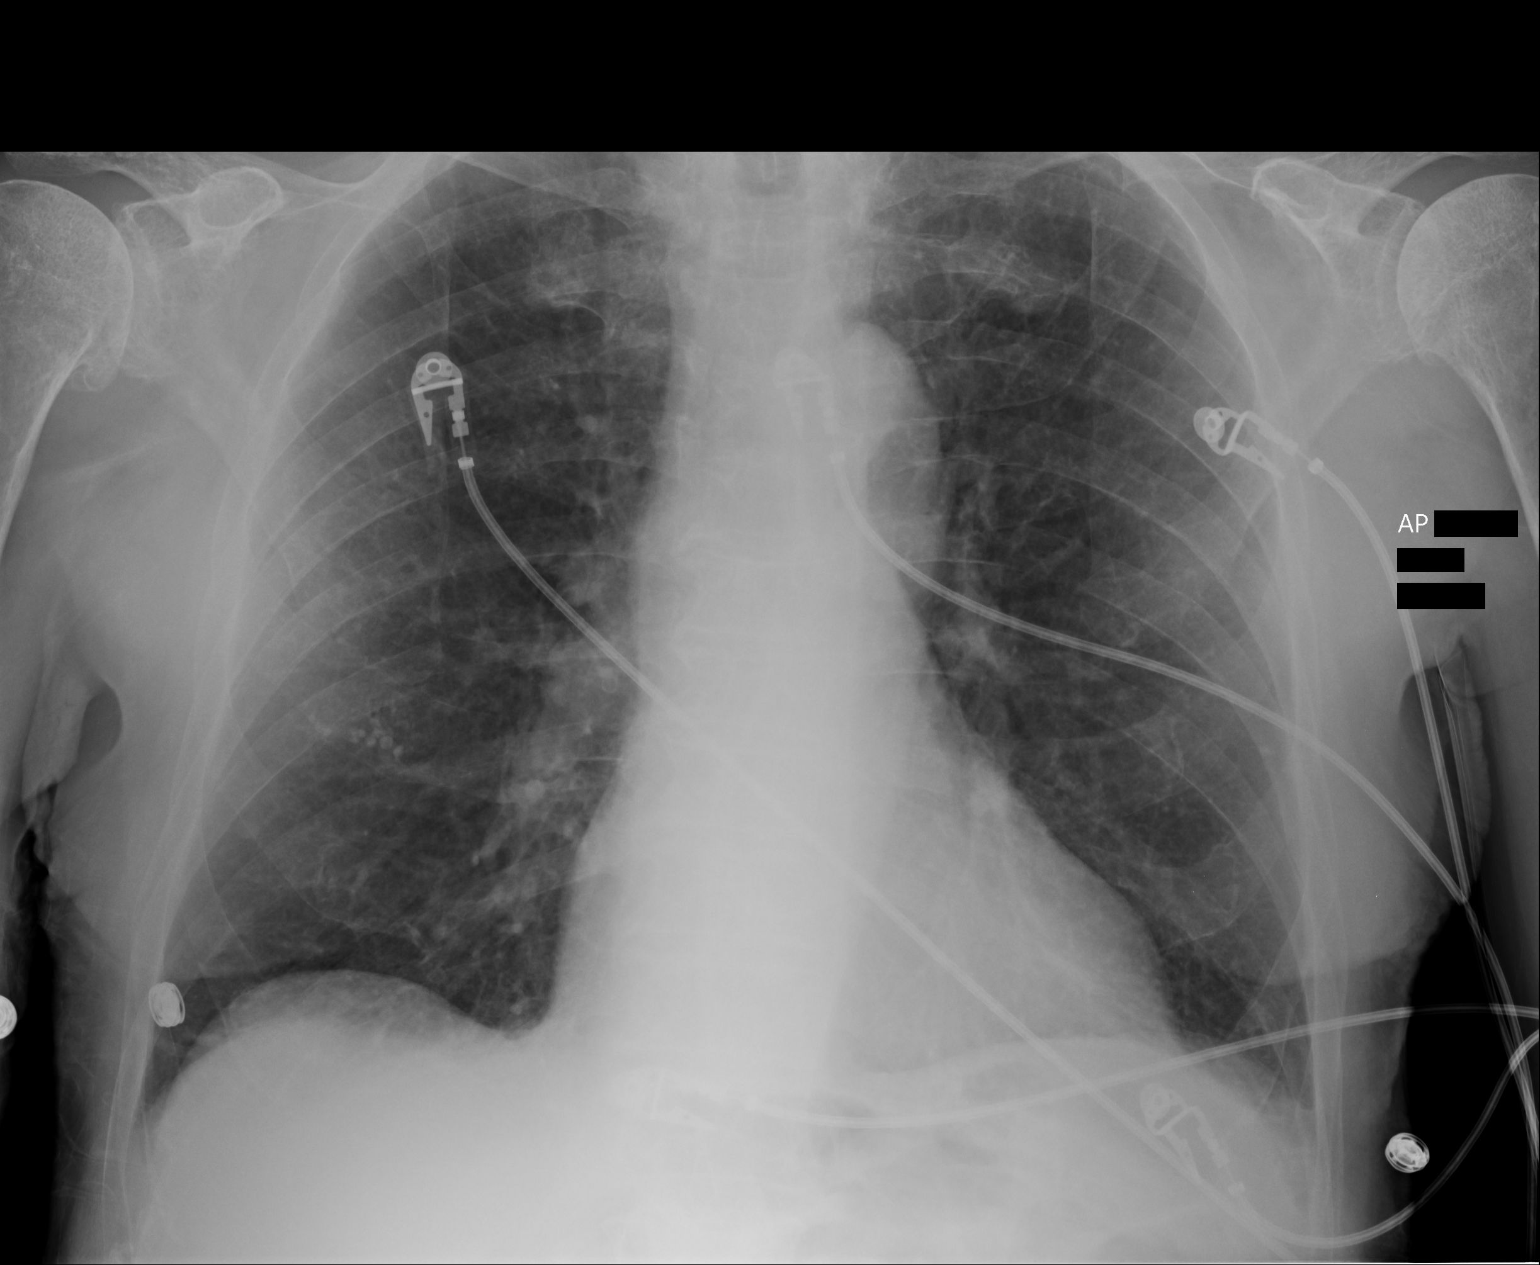

[1 of 1 positions shown; findings below may reference images not displayed]

IMPRESSION: 1. Chest radiograph without evidence of acute cardiopulmonary disease.
2. A comparison is made to prior study dated 05/20/2012.

## 2015-03-05 NOTE — Consult Note (Signed)
PATIENT NAME:  Gerald Hines, Gerald Hines MR#:  [id number] DATE OF BIRTH:  06-17-1914  DATE OF CONSULTATION:  09/09/2012  REFERRING PHYSICIAN:  Dr. Daniel Nones CONSULTING PHYSICIAN:  Rodman Key, NP / Dr. Lynnae Prude   PRIMARY CARE PHYSICIAN: Dr. Daniel Nones   REASON FOR CONSULTATION: GI bleed, anemia.   HISTORY OF PRESENT ILLNESS: Gerald Hines is a 81 year old Caucasian gentleman who has a significant past medical history of hypertension, atrial fibrillation, vitamin B12 deficiency and hypothyroidism. He had previously been admitted during this summer for worsening of anemia as well as the finding of heme-positive stool. He had an upper endoscopy done by Dr. Lutricia Feil during this hospitalization which revealed to be unremarkable. It was discussed with patient to proceed forward with a colonoscopy to allow direct luminal evaluation of lower GI tract and patient declined at that time. He has had two colonoscopies performed in his lifetime. Colonic polyps been retrieved both times and most recent colonoscopy was over 10 years ago. He was seen in our office by myself in follow up on 06/16/2012 after having been hospitalized and having same day surgery done. At that time again it was discussed to proceed with a colonoscopy and he declined. He had unfortunately fallen since his last office visit and broken his leg. Prior to the procedure he has been essentially on a gluten-free and lactose-free diet. After the procedure he started introducing glutenin and lactose products back into his diet and started to experience diarrhea again. Family members who are present state that it really has not made a difference on what type of a diet that he had been on. He continues to experience loose stools several times a day. Though when he was off of the gluten-free and the lactose diet his bowel movements were twelve times a day and when seen 08/01 it was down to three or four times a day. He does occasionally skip a day  without defecation as well. At this time he continues to decline nausea, vomiting, no abdominal pain. He has had no further falls for the past 3-1/2 months. He is complaining of shortness of breath. He has had no chest pain. Significant for edema bilaterally to lower extremities which has been present for numerous months, has also progressively became worse.   Patient was found to have a hemoglobin of 7.8 which was a noted drop from the range of 9 in August. From 8.5 about a week and a half ago. He has had significant amount of intestinal gas. During office visit with Dr. Graciela Husbands today color was noted to be pale and diagnosed with symptomatic anemia. Thus he was admitted to [facility]. He was initially admitted to room 217, upon hooking up on a heart monitor he was found to be in supraventricular tachycardia rhythm of 150 to 160. He has a known history of atrial fibrillation possibly underlying rhythm as well though rhythm appeared regular on monitor. Pending Cardizem drip being started.   PAST MEDICAL HISTORY:  1. Hypertension. 2. Chronic obstructive pulmonary disease. 3. Vitamin B12 deficiency. 4. Carpal tunnel with prior carpal total release. 5. History of colonic polyps.  6. History of atrial fibrillation. 7. Hypothyroidism. 8. Left spiral tibial fracture July 2013. 9. Status post appendectomy.  10. History of surgery on the right arm for skin cancer.   PAST SURGICAL HISTORY:  1. Appendectomy. 2. Right arm surgery.  3. Two colonoscopies in the past, most recent one was greater than 10 years ago.  4. EGD that was  done 2013.   ALLERGIES: ACE inhibitors cause hyperkalemia. Combivent causes headache. Ambien causes confusion.   HOME MEDICATIONS:  1. Levothyroxine 0.75 mg once a day. 2. Vitamin B12 500 mcg once a day. 3. Iron 145 mg once a day.  4. Gas relief extra strength 125 mg twice a day as needed. 5. Astelin 1 spray each nostril twice a day. 6. Tessalon Perles 100 mg 3 times a day as needed  for cough.   FAMILY HISTORY: Significant for hypertension. No family history of neoplasm.   SOCIAL HISTORY: No tobacco use for many years. Alcohol one glass of wine or beer a day, although he has recently abstained.   REVIEW OF SYSTEMS: Please see history of present illness. No fevers. No chills. Appetite is poor, four pound weight loss over the past month. No night sweats. No recent chest pain. Significant for shortness of breath. No GU complaints. No rashes. Remainder of complete review of systems is negative.   PHYSICAL EXAMINATION:  VITAL SIGNS: Temperature 98, pulse 158, respirations 21, blood pressure 99/56 with a pulse oximetry of 96% on room air.   GENERAL: Well-developed, slender 81 year old Caucasian gentleman, no initial distress noted but standing next to bed evidence of bed mildly shaking due to significance of heart rate. Slight dyspnea. No use of accessory muscles.   HEENT: Normocephalic, atraumatic. Pupils equal, reactive to light. Conjunctivae clear. Sclerae anicteric.   NECK: Supple. Trachea midline. Evidence of mild distention of neck veins with pulsation probably in relation to heart rate.   ABDOMEN: Soft, nondistended. Bowel sounds in four quadrants. No bruits. No masses, nontender.   RECTAL: Deferred.   MUSCULOSKELETAL: Gait not assessed. No contractures. No clubbing.   EXTREMITIES: +1 to +2 pitting edema bilaterally.   SKIN: No rashes. No lesion. Color pale, warm, dry.   NEUROLOGICAL: No gross neurological deficits noted.   PSYCH: Alert and oriented x4. Memory intact.   LABORATORY, DIAGNOSTIC AND RADIOLOGICAL DATA: Sodium 133, chloride 96, otherwise within normal limits. Hepatic panel: Albumin 2.1, AST is elevated at 65, otherwise within normal limits. Troponin 0.02. CBC: Hemoglobin 8.3 with hematocrit 25.9, RDW 17.7 with an RBC of 3.09. PT 15.7, INR 1.2 and PTT 44.9.   IMPRESSION: Chronic anemia with suspected chronic GI bleed. Hemoccult card August 2013 was  negative after having undergone GI evaluation inclusive of upper GI which was unremarkable. Patient declined proceeding with diagnostic colonoscopy during this timeframe. He wished to proceed with iron supplementation a d close monitoring. History of atrial fibrillation. Currently not on anticoagulant therapy for the past several weeks. Patient has been transferred to Critical Care Unit for heart rate of 150 to 160 symptomatic with shortness of breath.   PLAN: Patient's presentation was discussed with Dr. Lynnae Prudeobert Elliott. Recommendation initially is to proceed with colonic cleansing on Sunday with diagnostic colonoscopy on Monday of next week as patient is in agreement now to proceed forward in this manner. Due to current cardiac presentation and heart rate recommendation is to monitor over the weekend then date of colonoscopy to be decided when patient is clinically able. Cardiology consultation is requested. Order placed. Pending blood transfusion 1 unit has been ordered by Dr. Daniel NonesBert Klein and Cardizem CD drip is to be started. Will continue to follow, monitor hemodynamic status as well as laboratory studies.   These services were provided by myself under collaborative agreement with Dr. Lynnae Prudeobert Elliott.   ____________________________ Rodman Keyawn S. Harrison, NP dsh:cms D: 09/09/2012 16:56:43 ET T: 09/10/2012 11:41:16 ET JOB#: [id number]  cc:  Rodman Key, NP, <Dictator> DAWN Mort Sawyers MD ELECTRONICALLY SIGNED 09/19/2012 7:55

## 2015-03-05 NOTE — Consult Note (Signed)
Brief Consult Note: Diagnosis: Chronic anemia with suspected chronic GI bleed. Hemoccult card August 2013 was negative after being admitted for GI bleed.  Finding of anemia and heme positive stool.  EGD was unremarkable.  Patient declined proceeding with diagnostic colonoscopy at that time.  Wishes to proceed with iron supplementation and close monitoring.  History of atrial fibrillation.  Currently not on anticoagulation therapy. Transferred to CCU for heart rate of 150-160. Symptomatic with shortness of breath.   Consult note dictated.   Comments: Patient's presentation discussed with Dr. Lynnae Prudeobert Elliott.  Recommendation initially was to proceed with colonic cleansing on Sunday with diagnostic colonoscopy Monday of next week as patient is in agreement to proceed forward in this manner.  Due to current cardiac presentation and heart rate recommendation is to monitor over the weekend and then date of colonoscopy to be decided.  Cardiology consultation requested.  Pending blood transfusion and Cardizem gtt to be started. Will continue to follow, monitor hemodynamic status as well as laboratory studies.  Electronic Signatures: Rodman KeyHarrison, Dawn S (NP)  (Signed 25-Oct-13 16:46)  Authored: Brief Consult Note   Last Updated: 25-Oct-13 16:46 by Rodman KeyHarrison, Dawn S (NP)

## 2015-03-05 NOTE — Discharge Summary (Signed)
PATIENT NAME:  Gerald Hines, Hildred C MR#:  [id number] DATE OF BIRTH:  [birth year]  DATE OF ADMISSION:  09/09/2012 DATE OF DISCHARGE:  09/14/2012  FINAL DIAGNOSES:  1. Gastrointestinal bleeding.  2. Acute on chronic blood loss anemia secondary to #1.  3. Supraventricular tachycardia, symptomatic.  4. History of paroxysmal atrial fibrillation.   HISTORY AND PHYSICAL: Please see dictated admission history and physical.   SUMMARY OF HOSPITAL COURSE: The patient was admitted after being noted to have progressive anemia. He had been found to have heme positive stool, prior EGD was unrevealing. He originally had declined colonoscopy, however given his worsening symptoms it was felt that that this likely needed to be performed to try to give more definitive diagnosis. He also required transfusion secondary to his symptoms. He was admitted and went into supraventricular tachycardia shortly afterward, requiring movement to the Intensive Care Unit and placing him on a Cardizem drip. Fortunately, he broke back into normal sinus rhythm with this. He was found to have urinary retention, Foley was placed, but had at least 1 liter of urine, and Foley was left in place. He was transfused 1 unit of packed cells with improvement and with overall better symptoms. He was seen by gastroenterology, and he underwent prep a colonoscopy, which revealed polyps, diverticulosis, internal hemorrhoids, but no clear source for his bleeding. His hemoglobin levels were followed, they continued drift down, so was given a second unit of blood.   Further options were discussed, at this point we anticipate trying to treat the patient with more aggressive oral iron in hopes of keeping up with his blood loss anemia preventing further worsening anemia, however, the patient and family members are aware that this may not be possible. They are also aware that the patient may develop much worse bleeding in the near future as we do not have a true  source for the bleeding. We talked about capsule enteroscopy, however, the thought was that if we did find a problem in this area he would not be a candidate for any intervention and therefore they declined it.   At this point, he will be discharged to a skilled nursing facility in stable condition with his physical activity up with a walker as tolerated. He will follow a regular diet. Physical therapy will evaluate and treat the patient. We will anticipate him following up in our office in the next 1 to 2 weeks. We will perform labs there.  He will need Foley to gravity, and will give a trial of removal of this and try to get him to urology in the near future for his urinary retention.   DISCHARGE MEDICATIONS:  1. Tamsulosin 0.4 mg p.o. daily.  2. Levothyroxine 0.75 mg p.o. daily for hypothyroidism.  3. Vitamin B12 500 mcg p.o. daily.  4. Astelin one spray to each nostril daily.  5. Iron 145 mg p.o. twice a day with food. 6. Vitamin C 500 mg p.o. twice a day with iron.  7. Pantoprazole 40 mg p.o. daily.  8. ProctoFoam HC 1% to the rectum three times daily p.r.n. rectal irritation.   CODE STATUS: The patient was DO NOT RESUSCITATE during this hospitalization. An out of facility DNR form will be sent with him.  TIME SPENT ON DISCHARGE: 45 minutes. ____________________________ Lynnea FerrierBert J. Klein III, MD bjk:slb D: 09/14/2012 12:42:15 ET T: 09/14/2012 12:58:42 ET JOB#: [id number]  cc: Curtis SitesBert J. Klein III, MD, <Dictator> Daniel NonesBERT KLEIN MD ELECTRONICALLY SIGNED 09/14/2012 21:00

## 2015-03-05 NOTE — Consult Note (Signed)
Chief Complaint:   Subjective/Chief Complaint doing well, denies n/v or abdominal pain.  no bm.   VITAL SIGNS/ANCILLARY NOTES: **Vital Signs.:   26-Oct-13 11:23   Vital Signs Type Upon Transfer   Temperature Temperature (F) 97.4   Celsius 36.3   Temperature Source Oral   Pulse Pulse 58   Respirations Respirations 22   Systolic BP Systolic BP 614   Diastolic BP (mmHg) Diastolic BP (mmHg) 64   Mean BP 92   Pulse Ox % Pulse Ox % 97   Pulse Ox Activity Level  At rest   Oxygen Delivery Room Air/ 21 %   Brief Assessment:   Cardiac Irregular    Respiratory clear BS    Gastrointestinal details normal Soft  Nontender  Nondistended  No masses palpable  Bowel sounds normal   Lab Results: Routine Chem:  26-Oct-13 07:25    Glucose, Serum  105   BUN 14   Creatinine (comp) 0.86   Sodium, Serum 136   Potassium, Serum 4.1   Chloride, Serum 99   CO2, Serum 28   Calcium (Total), Serum  8.4   Anion Gap 9   Osmolality (calc) 273   eGFR (African American) >60   eGFR (Non-African American) >60 (eGFR values <89m/min/1.73 m2 may be an indication of chronic kidney disease (CKD). Calculated eGFR is useful in patients with stable renal function. The eGFR calculation will not be reliable in acutely ill patients when serum creatinine is changing rapidly. It is not useful in  patients on dialysis. The eGFR calculation may not be applicable to patients at the low and high extremes of body sizes, pregnant women, and vegetarians.)  Cardiac:  26-Oct-13 07:25    Troponin I 0.04 (0.00-0.05 0.05 ng/mL or less: NEGATIVE  Repeat testing in 3-6 hrs  if clinically indicated. >0.05 ng/mL: POTENTIAL  MYOCARDIAL INJURY. Repeat  testing in 3-6 hrs if  clinically indicated. NOTE: An increase or decrease  of 30% or more on serial  testing suggests a  clinically important change)  Routine UA:  26-Oct-13 02:57    Color (UA) Amber   Clarity (UA) Hazy   Glucose (UA) Negative   Bilirubin (UA)  Negative   Ketones (UA) Negative   Specific Gravity (UA) 1.013   Blood (UA) Negative   pH (UA) 6.0   Protein (UA) Negative   Nitrite (UA) Negative   Leukocyte Esterase (UA) Negative (Result(s) reported on 10 Sep 2012 at 03:22AM.)   RBC (UA) 1 /HPF   WBC (UA) 1 /HPF   Bacteria (UA) NONE SEEN   Epithelial Cells (UA) NONE SEEN   Mucous (UA) PRESENT (Result(s) reported on 10 Sep 2012 at 03:22AM.)  Routine Hem:  26-Oct-13 07:25    WBC (CBC) 8.2   RBC (CBC)  2.96   Hemoglobin (CBC)  8.3   Hematocrit (CBC)  24.9   Platelet Count (CBC) 282   MCV 84   MCH 28.1   MCHC 33.4   RDW  17.0   Neutrophil % 65.7   Lymphocyte % 17.7   Monocyte % 16.0   Eosinophil % 0.4   Basophil % 0.2   Neutrophil # 5.4   Lymphocyte # 1.5   Monocyte #  1.3   Eosinophil # 0.0   Basophil # 0.0 (Result(s) reported on 10 Sep 2012 at 08:08AM.)   Assessment/Plan:  Assessment/Plan:   Assessment 1) anemia, heme positive stool,  stable.  no overt GI bleeding.  2) episode of SVT yesterday-sr today  Plan 1) continue obs for now.  Dr  Vira Agar considering colonoscopy on tuesday as clinically feasible.   Electronic Signatures: Loistine Simas (MD)  (Signed 26-Oct-13 15:11)  Authored: Chief Complaint, VITAL SIGNS/ANCILLARY NOTES, Brief Assessment, Lab Results, Assessment/Plan   Last Updated: 26-Oct-13 15:11 by Loistine Simas (MD)

## 2015-03-05 NOTE — Consult Note (Signed)
Brief Consult Note: Diagnosis: atrial flutter with RVR.   Patient was seen by consultant.   Consult note dictated.   Comments: Check echo today.  Recommend rate control for now.  Continue Metoprolol.  Given borderline BP, I will load with Digoxin.  No anticoagulation due to anemia and occult blood in stool.  Electronic Signatures: Lorine BearsArida, Ruth Kovich (MD)  (Signed 23-Jul-13 09:49)  Authored: Brief Consult Note   Last Updated: 23-Jul-13 09:49 by Lorine BearsArida, Cozetta Seif (MD)

## 2015-03-05 NOTE — Consult Note (Signed)
Pt CC is anemia and heme positive stool.  He has previously declined a colonoscopy but did have an EGD which was negative.  He has now agreed to do a colonoscopy because of weakness associated with recurrent anemia, presumed from GI blood loss.  He had a spell of SVT with rate up to 150 range today.  He could tell he had a rapid rate but did not feel angina like pain during that time.  Dr. Odessa FlemingKlein's admit note reviewed.  Consult discussed with Owens Sharkawn Harrison.  I prefer to see how he does over the weekend due to his episode of SVT and if he remains in sinus rhythm perform a clean out Monday and a colonoscopy Tuesday. Will advance to full liquids for now.  Dr. Marva PandaSkulskie to see over the weekend.  Electronic Signatures: Scot JunElliott, Wendle Kina T (MD)  (Signed on 25-Oct-13 18:25)  Authored  Last Updated: 25-Oct-13 18:25 by Scot JunElliott, Larnce Schnackenberg T (MD)

## 2015-03-05 NOTE — Consult Note (Signed)
CC: heme pos stool, iron def anemia.  Pt results discussed with patient and family.  No sign of any source of chronic blood loss from colon.  Recommend iron and multivits forever.  Hold on capsule endoscopy.  Discussed with Dr. Graciela HusbandsKlein.  Electronic Signatures: Scot JunElliott, Robert T (MD)  (Signed on 29-Oct-13 10:16)  Authored  Last Updated: 29-Oct-13 10:16 by Scot JunElliott, Robert T (MD)

## 2015-03-05 NOTE — Consult Note (Signed)
Covering for Dr. Niel HummerIftikhar. EGD performed. Completely normal. Discussed results with patient and family. Normally, we would recommend colonoscopy. However, patient and family do not want to proceed with colonoscopy. Will resume previous diet. Will sign off. Thanks.  Electronic Signatures: Lutricia Feilh, Dhamar Gregory (MD)  (Signed on 25-Jul-13 12:10)  Authored  Last Updated: 25-Jul-13 12:10 by Lutricia Feilh, Anicka Stuckert (MD)

## 2015-03-05 NOTE — Consult Note (Signed)
Chief Complaint:   Subjective/Chief Complaint seen for anemia heme positive stool.  denies abdominal pain n/v.  tolerating full liquids.   VITAL SIGNS/ANCILLARY NOTES: **Vital Signs.:   27-Oct-13 09:28   Vital Signs Type Q 4hr   Temperature Temperature (F) 98.1   Celsius 36.7   Pulse Pulse 72   Respirations Respirations 20   Systolic BP Systolic BP 100   Diastolic BP (mmHg) Diastolic BP (mmHg) 60   Mean BP 73   Pulse Ox % Pulse Ox % 97   Pulse Ox Activity Level  At rest   Oxygen Delivery Room Air/ 21 %    14:39   Vital Signs Type Routine   Temperature Temperature (F) 100.1   Celsius 37.8   Temperature Source AdultAxillary   Pulse Pulse 75   Respirations Respirations 20   Systolic BP Systolic BP 148   Diastolic BP (mmHg) Diastolic BP (mmHg) 68   Mean BP 94   Pulse Ox % Pulse Ox % 96   Pulse Ox Activity Level  At rest   Oxygen Delivery Room Air/ 21 %  *Intake and Output.:   27-Oct-13 11:45   Stool  small loose brown stool   Brief Assessment:   Cardiac Regular    Respiratory clear BS    Gastrointestinal details normal Soft  Nontender  Nondistended  No masses palpable  Bowel sounds normal   Lab Results: Routine Chem:  25-Oct-13 15:14    BUN 16  26-Oct-13 07:25    BUN 14  27-Oct-13 05:40    Result Comment SMEAR - SMEAR SCANNED  Result(s) reported on 11 Sep 2012 at 06:32AM.  Routine Coag:  25-Oct-13 15:14    INR 1.2 (INR reference interval applies to patients on anticoagulant therapy. A single INR therapeutic range for coumarins is not optimal for all indications; however, the suggested range for most indications is 2.0 - 3.0. Exceptions to the INR Reference Range may include: Prosthetic heart valves, acute myocardial infarction, prevention of myocardial infarction, and combinations of aspirin and anticoagulant. The need for a higher or lower target INR must be assessed individually. Reference: The Pharmacology and Management of the Vitamin K  antagonists: the  seventh ACCP Conference on Antithrombotic and Thrombolytic Therapy. Chest.2004 Sept:126 (3suppl): L7870634. A HCT value >55% may artifactually increase the PT.  In one study,  the increase was an average of 25%. Reference:  "Effect on Routine and Special Coagulation Testing Values of Citrate Anticoagulant Adjustment in Patients with High HCT Values." American Journal of Clinical Pathology 2006;126:400-405.)  Routine Hem:  25-Oct-13 15:14    Hemoglobin (CBC)  8.3  26-Oct-13 07:25    Hemoglobin (CBC)  8.3  27-Oct-13 05:40    WBC (CBC) 8.6   RBC (CBC)  3.32   Hemoglobin (CBC)  9.2   Hematocrit (CBC)  28.0   Platelet Count (CBC) 315   MCV 84   MCH 27.8   MCHC 33.0   RDW  17.1   Neutrophil % 63.8   Lymphocyte % 18.1   Monocyte % 16.7   Eosinophil % 0.3   Basophil % 1.1   Neutrophil # 5.5   Lymphocyte # 1.5   Monocyte #  1.4   Eosinophil # 0.0   Basophil # 0.1   Assessment/Plan:  Assessment/Plan:   Assessment 1) anemia, heme positive stool.  stable 2) low grade fever, ude. apparently resolve on recheck? 3) svt-stable.    Plan 1) colonoscopy this week, likely prep tomorrow for proceedure on tuesday per Dr  Elliott.   Electronic Signatures: Barnetta ChapelSkulskie, Cantrell Larouche (MD)  (Signed 27-Oct-13 16:57)  Authored: Chief Complaint, VITAL SIGNS/ANCILLARY NOTES, Brief Assessment, Lab Results, Assessment/Plan   Last Updated: 27-Oct-13 16:57 by Barnetta ChapelSkulskie, Rehmat Murtagh (MD)

## 2015-03-05 NOTE — Consult Note (Signed)
Pt CC is iron def anemia and hemepositive stool.  Plan colonoscopy tomorrow.  Will not make the prep as cold as usual due to cold room and complaints of being very cold.  Chest clear, heart RRR.  Hgb stable over weekend.  Colonoscopy scheduled for tomorrow morning.  Electronic Signatures: Scot JunElliott, Alacia Rehmann T (MD)  (Signed on 28-Oct-13 17:34)  Authored  Last Updated: 28-Oct-13 17:34 by Scot JunElliott, Rosario Kushner T (MD)

## 2015-03-05 NOTE — Consult Note (Signed)
Chief Complaint:   Subjective/Chief Complaint Case discussed with Dr. Graciela HusbandsKlein and Dr. Bluford Kaufmannh. Dr. Bluford Kaufmannh will perform the EGD today and follow him over the weekend. I will be back Monday afternoon.   Electronic Signatures: Lurline DelIftikhar, Tersa Fotopoulos (MD)  (Signed 25-Jul-13 12:47)  Authored: Chief Complaint   Last Updated: 25-Jul-13 12:47 by Lurline DelIftikhar, Vondra Aldredge (MD)

## 2015-03-05 NOTE — H&P (Signed)
PATIENT NAME:  Gerald Hines, Gerald Hines MR#:  [id number] DATE OF BIRTH:  17-Sep-1914  DATE OF ADMISSION:  06/06/2012  PRIMARY CARE PHYSICIAN: Daniel Nones, MD   CHIEF COMPLAINT: Tachycardia, chest pain.  HISTORY OF PRESENT ILLNESS: This is a 81 year old very pleasant male with a history of hypertension and hypothyroidism who was planned for an EGD this morning and was sent from EGD due to symptoms as stated above. The patient says at approximately 9 o'clock this morning while he was sitting in his chair he felt very unusual. He started having palpitations, felt short of breath, and then developed some chest pain in the center of his chest which radiated to his left arm. No numbness, nausea, diaphoresis. He did have some blurred vision. He says he might have blacked out for a very short time this morning while all of this was going on. He's had symptoms in the past similar to this. He said that he thought it was related to heartburn and he has taken TUMS in the past which relieved these symptoms. In the ER there is question if the patient is in atrial fibrillation. The EKG appears to have sinus tachycardia and the patient is currently in sinus tach with a heart rate anywhere between 100 to 118.   REVIEW OF SYSTEMS: CONSTITUTIONAL: No fever, fatigue, or weakness. EYES: No blurred vision. No pain or redness. ENT: No ear pain. He has some hearing loss. No postnasal drip. RESPIRATORY: No cough, wheezing, hemoptysis, or COPD. CARDIOVASCULAR: Chest pain as mentioned above. Positive palpitations. No orthopnea. No edema, arrhythmia, dyspnea on exertion. GI: No nausea or vomiting. Had diarrhea. He's had some abdominal pain which is why he is going for an EGD. No hematemesis, melena, or ulcers. GU: No dysuria or hematuria. ENDOCRINE: No nocturia. HEME/LYMPH: Positive anemia. No easy bruising, bleeding, or swollen glands. SKIN: No rash or lesions. MUSCULOSKELETAL: No pain in the shoulders or knees. He has limited activity due  to a tibial fracture. NEUROLOGIC: No history of CVA or TIA. PSYCH: He has some depression as he recently lost his wife.   PAST MEDICAL HISTORY:  1. Hypertension.  2. Hypothyroidism.  3. Benign prostatic hypertrophy. 4. Anemia.  MEDICATIONS:  1. Vitamin C 1 tablet daily.  2. Vitamin B12 1000 mcg daily.  3. Tamsulosin 0.4 mg daily.  4. Losartan 100 mg daily.  5. Loperamide 2 mg 2 tablets as needed q.8 hours.  6. Synthroid 75 mcg daily.  7. Iron polysaccharide 1 tablet daily.  8. Aspirin 81 mg daily.   ALLERGIES: Combivent and ACE inhibitors.   PAST SURGICAL HISTORY: None.   FAMILY HISTORY: Unknown.   SOCIAL HISTORY: Daily alcohol drink. No tobacco or IV drug use.   PHYSICAL EXAMINATION:   VITAL SIGNS: Temperature 97.6, pulse 109, respirations 18, blood pressure 108/61, 100% on room air.   GENERAL: The patient is alert, oriented, not in any acute distress.   HEENT: Head is atraumatic. Pupils are round and reactive. Sclerae anicteric. Mucous membranes are moist. Oropharynx is clear.   NECK: Supple without JVD, carotid bruit, or enlarged thyroid.   CARDIOVASCULAR: Tachycardia. No murmurs, gallops, or rubs. PMI is not displaced. Regular rate.  LUNGS: Clear to auscultation without crackles, rales, rhonchi, or wheezing. Normal percussion.   ABDOMEN: Bowel sounds are positive. Nontender, nondistended. No hepatosplenomegaly.   EXTREMITIES: No clubbing, cyanosis, or edema.   NEUROLOGIC: Cranial nerves II through XII are intact. No focal deficits.   SKIN: Without rash or lesions.   BACK: No CVA  or vertebral tenderness.   LABORATORY, DIAGNOSTIC, AND RADIOLOGICAL DATA: White blood cells 7, hemoglobin 10.2, hematocrit 31, platelets 299, sodium 134, potassium 4.3, chloride 99, bicarb 26, BUN 29, creatinine 1.38, glucose 137. Troponin 0.03. CK 17. CPK-MB less than 0.5.   EKG is sinus tachycardia. No ST elevations or depressions.   Chest x-ray shows no acute cardiopulmonary  disease.   ASSESSMENT AND PLAN: This is a 81 year old male who was planned for an EGD due to abdominal pain and diarrhea who was complaining of some chest pain and palpitations, noted to have sinus tachycardia, questionable atrial fibrillation.  1. Chest pain. His chest pain sounds like it is related to his palpitations. I spoke with the patient and the patient's family. Given his age what we have decided to do is to monitor him on telemetry and continue to cycle his cardiac enzymes. Check an EKG this afternoon as well as tomorrow evening. No stress test since the patient would not want stenting or any surgery if he does have atherosclerotic disease and at his age I would be surprised if he did not have any atherosclerotic disease. I do think his chest pain is probably related to his palpitations. He is planned for an EGD. There is question whether he may have ulcers from holding his aspirin as it has been held for now and I've placed him on a PPI.  2. Tachycardia. This appears to be sinus tachycardia in nature. I see P waves on the EKG as well as on his rhythm strip here in the ER. Will continue to monitor on telemetry. Order another EKG this afternoon and one tomorrow. If the patient were to have atrial fibrillation, he would not want to be on Coumadin and right now due to his questionable ulcers per the family I will hold off on aspirin. I will also check a TSH. He may have some mild renal insufficiency from dehydration which is also driving his heart rate so I will provide some very gentle hydration. I've changed his losartan to metoprolol for better heart rate control.  3. Left tibial fracture. He has an appointment next Monday with Orthopedics. For now I'll hold off on PT consult.  4. Hypothyroidism. Due to his sinus tachycardia, I will check a TSH and continue Synthroid for now.  5. Hypertension. Will hold losartan and change metoprolol due to the tachycardia and the mild renal insufficiency.   6. Acute renal insufficiency likely from some mild dehydration. Will provide some IV fluids. Hold losartan.  7. Hyperglycemia. Will repeat a BMP in the a.m. No further work-up at this time in this 81 year old male.   CODE STATUS: The patient is LIMITED CODE; yes to CPR but no ventilation.   TIME SPENT: Approximately 60 minutes.   ____________________________ Janyth ContesSital P. Juliene PinaMody, MD spm:drc D: 06/06/2012 13:29:03 ET T: 06/06/2012 13:47:15 ET JOB#: [id number]  cc: Maeli Spacek P. Juliene PinaMody, MD, <Dictator> Lynnea FerrierBert J. Klein III, MD Janyth ContesSITAL P Jayleena Stille MD ELECTRONICALLY SIGNED 06/06/2012 14:37

## 2015-03-05 NOTE — Consult Note (Signed)
Brief Consult Note: Diagnosis: Anemia and heme positive stools.   Patient was seen by consultant.   Comments: Anemia with heme positive stools. No signs of active GI bleed anf H and H seems to be stable. Syncope secondary to A. fibb with RVR. Patient now with significant bradycardia.  Recommendations: Will observe another 24 hours before making any decision about EGD as IP due to significant bradycardia. Discussed with him and he is in full agreement. Will re-evaluate in am and make further recommendations. Thanks.  Electronic Signatures: Lurline DelIftikhar, Cale Decarolis (MD)  (Signed 24-Jul-13 12:44)  Authored: Brief Consult Note   Last Updated: 24-Jul-13 12:44 by Lurline DelIftikhar, Quiara Killian (MD)

## 2015-03-05 NOTE — Consult Note (Signed)
PATIENT NAME:  Gerald Hines, Imir C MR#:  [id number] DATE OF BIRTH:  [birth year]  DATE OF CONSULTATION:  09/09/2012  REFERRING PHYSICIAN:  Dr. Graciela HusbandsKlein  CONSULTING PHYSICIAN:  Lamar BlinksBruce J. Nyaja [place], MD  PRIMARY CARE PHYSICIAN: Dr. Graciela HusbandsKlein   REASON FOR CONSULTATION: Atrial fibrillation with rapid ventricular rate.   CHIEF COMPLAINT: Patient is somewhat obtunded.   HISTORY OF PRESENT ILLNESS: This is a 81 year old male with known history of intermittent atrial fibrillation who was previously placed on anticoagulation who has had an acute onset of bleeding complications. The patient has had significant anemia and needed further intervention including colonoscopy, upper endoscopy for further evaluation as well as concerns for the possibility of discontinuation of anticoagulation. The patient has had discontinuation of anticoagulation and now will need packed red blood cells. At that time the patient has had some shortness of breath and low blood pressure with a heart rate rapid in nature at 150 beats per minute with an EKG consistent with supraventricular tachycardia or atrial flutter with rapid ventricular rate. The patient is comfortable at this time without evidence of significant congestive heart failure.   REVIEW OF SYSTEMS: A review of systems cannot be assessed due to obtundation.   PAST MEDICAL HISTORY:  1. Atrial flutter.  2. Hypertension.  3. Hyperlipidemia.  4. Anemia with bleeding.   FAMILY HISTORY: No family members with early onset of cardiovascular disease or hypertension.   SOCIAL HISTORY: Patient currently denies alcohol or tobacco use.   ALLERGIES: He has no known drug allergies.   CURRENT MEDICATIONS: As listed.   PHYSICAL EXAMINATION:  VITAL SIGNS: Blood pressure 80/50, heart rate 140 beats per minute.   GENERAL: He is an ill-appearing elderly male in no acute distress.   HEENT: No icterus, thyromegaly, ulcers, hemorrhage, or xanthelasma.   CARDIOVASCULAR: Rapid rate. Normal  S1 and S2, 2/6 apical murmur consistent with mitral regurgitation. Point of maximal impulse is diffuse. Carotid upstroke normal without bruit. Jugular venous pressure normal.   LUNGS: Lungs have bibasilar crackles with normal respirations.   ABDOMEN: Soft, nontender without hepatosplenomegaly or masses. Abdominal aorta is normal size without bruit.   EXTREMITIES: 2+ bilateral pulses in dorsal, pedal, radial, femoral without lower extremity edema, cyanosis, clubbing, ulcers.   NEUROLOGIC: Patient is not oriented.   ASSESSMENT: 81 year old male with hypertension, hyperlipidemia, anemia, atrial fibrillation now with atrial flutter with rapid ventricular rate with 2:1 block needing further medical management and treatment options.   RECOMMENDATIONS:  1. Intravenous injection of Cardizem at 15 mg with 15 mg/h drip for heart rate control between 90 and 120 beats per minute.  2. Continue to abstain from anticoagulation due to bleeding issues.  3. Oxygen for increased oxygen carrying capacity.  4. Packed red blood cells, 1 to 2 units depending on need and hemoglobin.  5. Consider echocardiogram for LV systolic dysfunction, valvular heart disease.  6. Further treatment options and adjustments of medications thereof after above.  ____________________________ Lamar BlinksBruce J. Shy Guallpa, MD bjk:cms D: 09/09/2012 17:38:53 ET T: 09/10/2012 12:49:13 ET  JOB#: [id number] Lamar BlinksBRUCE J Adalei Novell MD ELECTRONICALLY SIGNED 09/13/2012 8:46

## 2015-03-05 NOTE — H&P (Signed)
PATIENT NAME:  Gerald Hines, Gerald Hines MR#:  [id number] DATE OF BIRTH:  [birth year]  DATE OF ADMISSION:  09/09/2012  CHIEF COMPLAINT: Weakness.   HISTORY OF PRESENT ILLNESS: The patient is an 81 year old male with a history of hypertension, atrial fibrillation, B12 deficiency, and hypothyroidism who was previously evaluated for heme-positive stool. At that time no source of bleeding was found, however, colonoscopy was not performed due to patient preference. Since that time he has been on iron therapy, and he has been taken off all anticoagulation secondary to heme-positive stool. Unfortunately, he has continued to feel weaker, feels washed out, no energy, and feels cold. He was found to have hemoglobin of 7.8, down from 8.5 about one half weeks ago. He has noted that his bowels have been irregular for several months, although this also correlates with the time he has been on iron therapy. He has not seen any bright red blood. He has a lot of gas but not a lot of abdominal pain. He denies any recent chest pain. He has some dyspnea on exertion and occasionally has some palpitations, but this has not caused him any major symptoms. On evaluation in the office today, he appears pale, and appears to have symptomatic anemia from his progressive blood loss. He is willing to reconsider colonoscopy at this time and is admitted for further evaluation of his progressive blood loss anemia and gastrointestinal bleeding to include GI evaluation and probable colonoscopy.   PAST MEDICAL HISTORY:  1. Hypertension.  2. Chronic obstructive pulmonary disease, not requiring inhalers.  3. B12 deficiency.  4. History of carpal tunnel with prior carpal tunnel release.  5. Remote history of colon polyps.  6. History of atrial fibrillation.  7. Hypothyroidism.  8. History of left spiral tibial fracture in July 2013, with improvement based on last orthopedic notes.  9. Status post appendectomy.  10. History of surgery on the right  arm for skin cancer.   ALLERGIES: ACE inhibitors caused hyperkalemia; Combivent causes headache; Ambien causes confusion.   MEDICATIONS:  1. Levothyroxine 0.75 mg p.o. daily.  2. Vitamin B12 500 mcg p.o. daily.  3. Iron 145 mg p.o. daily.  4. Gas Relief extra strength 125 mg p.o. twice a day p.r.n.  5. Astelin one spray each nostril twice a day. 6. Tessalon Perles 100 mg p.o. three times daily p.r.n. cough.   SOCIAL HISTORY: No tobacco times many years. Drinks approximately one glass of beer or wine a day, although he stopped this more recently.   FAMILY HISTORY: Hypertension.   REVIEW OF SYSTEMS: Please see history of present illness. Denies fevers, chills. Appetite poor, weight down 4 pounds over the last one month. No night sweats. No recent chest pain. No urinary symptoms. No rash. The remainder of complete review of systems is negative.   PHYSICAL EXAMINATION:   VITAL SIGNS: Weight 154, blood pressure 122/46, and pulse 74.   GENERAL: Pale-appearing male, appears fatigued.   EYES: Conjunctivae pale. Pupils round and reactive to light.   EARS, NOSE, AND THROAT: External examination unremarkable. The oropharynx is moist without lesions.   NECK: Supple, trachea midline. No thyromegaly.   CARDIOVASCULAR: Regular rate and rhythm without gallops or rubs. Carotid and radial pulses 2+. Distal pulses 1+.   LUNGS: Few dry crackles heard in the bases without wheeze or retractions.   ABDOMEN: Soft, nontender, and nondistended. Positive bowel sounds. No guard, rebound.   SKIN: Pale. No significant rashes or nodules.   LYMPH NODES: No cervical or supraclavicular nodes.  MUSCULOSKELETAL: No clubbing, cyanosis, or significant edema.   NEUROLOGIC: Cranial nerves intact except for hearing loss. Movement appears intact in all extremities other than some decreased range of motion in the left leg secondary to previous fracture.   IMPRESSION AND PLAN:  1. Gastrointestinal  bleeding/progressive acute on chronic blood loss anemia. The patient is now symptomatic. Will anticipate transfusing 1 unit today, and likely transfuse an additional unit tomorrow depending on his response. Place on telemetry. Check coagulation studies, check troponin as well, and we will get an EKG. His heart rate, blood pressure have been reasonably steady, he is actually off all blood pressure medications and he has been off all anticoagulation now for several weeks. Gastroenterology consultation will be obtained, and we discussed risks/benefits of colonoscopy. At this point, hopefully we can proceed with this after he has been transfused. Place him on clear liquids for now. Continue on proton pump inhibitor. I have discussed this with gastroenterology and anticipate likely performing colonoscopy on Monday after his has gotten a chance to get blood in and seeing that he has tolerated the prep.  2. Hypothyroidism. Continue on thyroid medication at home dose. This level has been stable.  3. Atrial fibrillation. Again he is off all treatment for this currently based on low blood pressures recently. Watch heart rate on telemetry. ____________________________ Lynnea FerrierBert J. Klein III, MD bjk:slb D: 09/09/2012 15:01:47 ET T: 09/09/2012 15:11:46 ET JOB#: [id number]  cc: Lynnea FerrierBert J. Klein III, MD, <Dictator> Daniel NonesBERT KLEIN MD ELECTRONICALLY SIGNED 09/12/2012 7:57

## 2015-03-05 NOTE — Consult Note (Signed)
PATIENT NAME:  Gerald Hines, Gerald Hines MR#:  [id number] DATE OF BIRTH:  31-Oct-1914  DATE OF CONSULTATION:  06/07/2012  REFERRING PHYSICIAN:  Adrian SaranSital Mody, MD  CONSULTING PHYSICIAN:  Haasini Patnaude A. Kirke CorinArida, MD  PRIMARY CARE PHYSICIAN: Daniel NonesBert Klein, MD   REASON FOR CONSULTATION: Tachycardia and chest pain.   HISTORY OF PRESENT ILLNESS: This is a 81 year old very pleasant male with history of hypertension and hypothyroidism. The patient was recently found to have anemia with positive occult blood in the stool. He was planned to have an EGD done yesterday morning. However, around 9 o'clock in the morning the patient felt unusual and had a syncopal episode which, according to him, was only brief. This was followed by palpitations, shortness of breath, and chest discomfort radiating to his left arm. He started having some blurred vision. The patient reports frequent palpitations over the last few months. The last episode happened on July 5th when it lasted for about one hour but did not seek medical attention at that time. He has no previous cardiac history. He denies any history of myocardial infarction or arrhythmia.   PAST MEDICAL HISTORY:  1. Hypertension.  2. Hypothyroidism.  3. Benign prostatic hypertrophy.  4. Anemia with positive occult blood in the stool.   HOME MEDICATIONS:  1. Vitamin Hines.  2. Vitamin B12.  3. Tamsulosin 0.4 mg once daily.  4. Losartan 100 mg once daily.  5. Loperamide 2 mg 2 tablets every eight hours as needed.  6. Synthroid 75 mcg once daily.  7. Aspirin 81 mg once daily.   ALLERGIES: Combivent and ACE inhibitors.   FAMILY HISTORY: Family history is unknown.   SOCIAL HISTORY: Negative for tobacco or drug use. He drinks alcohol occasionally. He is widowed. His wife actually died recently early this month.   REVIEW OF SYSTEMS: A 10 point review of systems was performed. It is negative other than what is mentioned in the history of present illness.   PHYSICAL EXAMINATION:    GENERAL: The patient appears to be younger than his stated age in no acute distress.   VITAL SIGNS: Temperature 98, pulse 129 beats per minute, respiratory rate 20, blood pressure 109/69, oxygen saturation is 98% on 2 liters nasal cannula.   HEENT: Normocephalic, atraumatic.   NECK: No JVD or carotid bruits.   RESPIRATORY: Normal respiratory effort with no use of accessory muscles. Auscultation reveals normal breath sounds.   CARDIOVASCULAR: He is tachycardic with regular rate. No gallops or murmurs are appreciated.   ABDOMEN: Benign, nontender, nondistended.   EXTREMITIES: No clubbing, cyanosis, or edema.   SKIN: Warm and dry with no rash.   PSYCH: Alert and oriented x3 with normal mood and affect.   LABORATORY AND RADIOLOGICAL DATA: His labs showed slight elevation in creatinine which improved with hydration. Creatinine is 1.13. Troponin initially was negative but subsequently increased to 0.10. TSH was normal. Hemoglobin was 9.1.   ECG showed atrial flutter with variable ventricular response.    IMPRESSION:  1. Atrial flutter with rapid ventricular response.  2. Chest pain with mildly elevated cardiac enzymes, likely supply/demand ischemia.  3. Hypertension.  4. Anemia with positive occult blood in the stool, currently undergoing work-up.   RECOMMENDATIONS: The patient is currently in atrial flutter with rapid ventricular response. I suspect that this is the etiology for his current presentation. His chest pain is likely due to tachycardia and supply/demand ischemia. He is already on metoprolol 25 mg every six hours with borderline low blood pressure. Thus, it would  probably be difficult to increase his medication. Due to that, I will go ahead and load him with digoxin to try to control his rate. Unfortunately, he cannot be anticoagulated at this point due to significant risk of bleeding related to his known anemia and likely GI source. I will request an echocardiogram to  evaluate his LV systolic function. The patient wants as conservative of approach as possible. He is also worried about ever starting anticoagulation. He informs me that his wife might have died from complications of anticoagulation for atrial fibrillation.   ____________________________ Chelsea Aus Kirke Corin, MD maa:drc D: 06/07/2012 09:56:57 ET T: 06/07/2012 11:08:12 ET JOB#: [id number]  cc: Torie Priebe A. Kirke Corin, MD, <Dictator> Lynnea Ferrier, MD [facility] [place] Donovan MD ELECTRONICALLY SIGNED 06/15/2012 15:25

## 2015-03-05 NOTE — Discharge Summary (Signed)
PATIENT NAME:  Gerald Hines, Gerald Hines MR#:  [id number] DATE OF BIRTH:  Mar 26, 1914  DATE OF ADMISSION:  06/06/2012 DATE OF DISCHARGE:  06/10/2012   FINAL DIAGNOSES:  1. Atrial fibrillation and flutter with rapid ventricular rate.  2. Demand ischemia and chest pain secondary to #1.  3. Left tibial plateau fracture.  4. Gastrointestinal bleeding.  5. Acute blood loss anemia secondary to gastrointestinal bleeding.  6. Hypertension.  7. Hypothyroidism.  8. Benign prostatic hypertrophy.   HISTORY AND PHYSICAL: Please see dictated admission history and physical.   SUMMARY OF HOSPITAL COURSE: The patient was admitted with chest discomfort and tachycardia. His telemetry and EKGs revealed evidence of supraventricular tachycardia most consistent with atrial fibrillation and atrial flutter with rapid ventricular rate. His medications were adjusted with improved rate control and digoxin was added as well which he tolerated. Cardiology saw the patient and he underwent echocardiogram which revealed preserved LV function.   He had recently had a fall with left tibial plateau fracture. He was maintained with a knee immobilizer. He will follow-up with Orthopedics as scheduled next week. His pain was reasonably controlled. He had had recent Doppler which was negative.   The patient had recently been noted to have heme positive stool and has had a steadily decreasing hemoglobin level. GI saw the patient and he went underwent EGD while he was here which revealed no source for the bleeding. His hemoglobin levels are stable at approximately 9 but he will not be a candidate for any anticoagulants secondary to GI bleeding. The risk of blood clots was stressed to the patient, the risk of clotting with atrial fibrillation was also stressed with the patient, and he understands the risks and benefits of avoiding anticoagulation. He is comfortable with this, particularly given that his wife recently passed after having  significant bleeding related to Coumadin.   We discussed colonoscopy as further evaluation for GI bleeding, however the patient did not wish to pursue this and is aware of the potential risk of more serious underlying problem causing bleeding that we would not be able to detect without direct visualization. He accepted this risk. He wished to return to skilled nursing facility and at this time he will be discharged to the skilled nursing facility in stable condition with his physical activity to be up with assistance. He will be nonweightbearing on the left leg and will have knee immobilizer in place. His diet will be bland for one week, then no added salt. He should have TED hose, knee high, bilaterally off one hour q. shift. Physical therapy and Occupational therapy will evaluate and treat the patient. He wished to follow-up in our office and so he will be in to see us within the next one week and will get repeat blood count at that time. He will follow-up in one week with Dr. Mariah MillingGollan and next week as already scheduled with Orthopedics with Cranston Neighborhris Gaines, physician's assistant.   DISCHARGE MEDICATIONS:  1. Tamsulosin 0.4 mg p.o. daily.  2. Levothyroxine 0.75 mg p.o. daily. 3. Vitamin B12 1000 mcg p.o. daily for anemia.  4. Omeprazole 20 mg p.o. daily. 5. Lopressor 25 mg p.o. b.i.d.  6. Digoxin 0.125 mg p.o. daily; risks/benefits of this medication were addressed with the patient.  7. Slow Fe 145 mg p.o. daily.  8. He will not take Losartan or aspirin at this time. If his blood pressure begins to increase, Losartan can be reintroduced. We will anticipate trying to reintroduce aspirin at a later date, likely  after his follow-up.   CODE STATUS: The patient was LIMITED CODE with no mechanical ventilation or intubation but willing to undergo any other resuscitative measures. A MOST form was filled out and sent with him to the nursing facility.   DISCHARGE TIME: 45 minutes.    ____________________________ Lynnea Ferrier, MD bjk:drc D: 06/10/2012 07:41:03 ET T: 06/10/2012 09:04:10 ET JOB#: [id number]  cc: Lynnea Ferrier, MD, <Dictator>, Antonieta Iba, MD, Evon Slack, PA-Hines Daniel Nones MD ELECTRONICALLY SIGNED 06/17/2012 13:36

## 2015-03-05 NOTE — Consult Note (Signed)
PATIENT NAME:  Gerald Hines, [name] MR#:  [id number] DATE OF BIRTH:  [birth year]  DATE OF CONSULTATION:  06/08/2012  REFERRING PHYSICIAN:   CONSULTING PHYSICIAN:  Lurline DelShaukat Sharonica Kraszewski, MD  PRIMARY CARE PHYSICIAN: Daniel NonesBert Klein, MD  REASON FOR CONSULTATION: Anemia and heme-positive stool.   HISTORY OF PRESENT ILLNESS: This is a 81 year old male with history of hypertension and hypothyroidism. The patient was recently evaluated by Dr. Bluford Kaufmannh due to anemia and heme-positive stool. Apparently a colonoscopy was unremarkable and he was scheduled to have an EGD. The patient on the day of his scheduled EGD started to have palpitations. He felt short of breath and developed chest pain. He probably blacked out for a short period of time. The EKG showed sinus tachycardia and the patient was subsequently admitted to the hospital for further management. Gastroenterology was consulted because of his heme-positive stool and anemia. The patient was evaluated by Dr. Mariah MillingGollan and Dr. Lorine BearsMuhammad Arida and cleared for an upper gastrointestinal endoscopy as his heart rate remained stable after hospitalization. The patient denied any nausea, vomiting, or significant abdominal pain. No other significant GI symptoms were reported by the patient.   PAST MEDICAL HISTORY:  1. Hypertension. 2. Hypothyroidism. 3. Benign prostatic hypertrophy. 4. Chronic anemia.   HOME MEDICATIONS: Vitamin C, vitamin B12, losartan, loperamide, Synthroid, iron, and aspirin.   ALLERGIES: Combivent and ACE inhibitor.   PAST SURGICAL HISTORY: None.   FAMILY HISTORY: Unremarkable.   SOCIAL HISTORY: He drinks on a daily basis, does not smoke.   PHYSICAL EXAMINATION:   GENERAL: Well-built male who did not appear to be in any acute distress, fully awake, alert, and oriented.  VITALS: His heart rate was in the 50s, blood pressure was about 100/60, and temperature 98.2.   HEENT: Clinically he did not appear to be severely anemic or jaundiced.   LUNGS:  Clear to auscultation bilaterally with fair entry.   CARDIOVASCULAR: Regular rate and rhythm. No gallops or murmurs. Bradycardia was noted.   ABDOMEN: He has a fairly benign abdomen which is soft, nontender, and nondistended. No rebound or guarding was noted.   EXTREMITIES: No edema, clubbing, or cyanosis.   NEUROLOGIC: Unremarkable.   LABORATORY STUDIES: Hemoglobin 9.8, hematocrit 28.9, MCV 90, and white cell count 7.3. Electrolytes, BUN, and creatinine were fairly unremarkable.   ASSESSMENT AND PLAN: The patient is with history of heme positive stool and anemia. The patient was scheduled to undergo an upper GI endoscopy but developed tachycardia which appeared to be sinus tachycardia. The patient developed some chest pain but was ruled out for myocardial infarction. The patient then became bradycardic although his heart rate remained fairly stable for 24 to 48 hours. The case was discussed with Dr. Daniel NonesBert Klein and it was decided to proceed with an upper GI endoscopy. As Dr. Bluford Kaufmannh was planning to perform an upper GI endoscopy         to begin with, the case was discussed with Dr. Bluford Kaufmannh who has performed the upper gastrointestinal endoscopy as of 06/09/2012. Please see his report and recommendations. No further recommendations from us.  ____________________________ Lurline DelShaukat Rourke Mcquitty, MD si:slb D: 06/15/2012 12:20:00 ET T: 06/15/2012 12:39:39 ET JOB#: [id number]  cc: Lurline DelShaukat Giulliana Mcroberts, MD, <Dictator> Lurline DelSHAUKAT Trameka Dorough MD ELECTRONICALLY SIGNED 06/17/2012 13:23

## 2015-03-10 NOTE — Consult Note (Signed)
Brief Consult Note: Diagnosis: Left nondisplaced tibia fracture.   Discussed with Attending MD.   Comments: Discussed with Dr. Ladona Ridgelaylor from the ED.  Patient fell while being consoled for the loss of his wife today.  Has a non-displaced left proximal tibia fracture.  No orthopaedic surgery is needed at this time.  Recommend placing left leg in a knee immobilizer.  He should follow-up in my office in 7-10 days.  May need SNF care if he is unable to walk while NWB on the left lower extremity as I have recommended.  Electronic Signatures: Juanell FairlyKrasinski, Nature Vogelsang (MD)  (Signed 05-Jul-13 16:26)  Authored: Brief Consult Note   Last Updated: 05-Jul-13 16:26 by Juanell FairlyKrasinski, Ramelo Oetken (MD)
